# Patient Record
Sex: Female | Born: 1985 | Race: White | Hispanic: No | Marital: Married | Smoking: Former smoker
Health system: Southern US, Community
[De-identification: ages and names within clinical notes are randomized; demographics above are authoritative.]

## PROBLEM LIST (undated history)

## (undated) DIAGNOSIS — R1013 Epigastric pain: Secondary | ICD-10-CM

## (undated) DIAGNOSIS — R519 Headache, unspecified: Secondary | ICD-10-CM

## (undated) DIAGNOSIS — R112 Nausea with vomiting, unspecified: Secondary | ICD-10-CM

## (undated) DIAGNOSIS — N938 Other specified abnormal uterine and vaginal bleeding: Secondary | ICD-10-CM

## (undated) DIAGNOSIS — R51 Headache: Secondary | ICD-10-CM

## (undated) HISTORY — DX: Other specified abnormal uterine and vaginal bleeding: N93.8

## (undated) HISTORY — PX: WISDOM TOOTH EXTRACTION: SHX21

---

## 2010-09-06 ENCOUNTER — Ambulatory Visit: Payer: Self-pay | Admitting: Unknown Physician Specialty

## 2010-09-11 ENCOUNTER — Inpatient Hospital Stay: Payer: Self-pay

## 2013-03-09 ENCOUNTER — Ambulatory Visit: Payer: Self-pay | Admitting: Obstetrics and Gynecology

## 2013-03-09 LAB — CBC WITH DIFFERENTIAL/PLATELET
Basophil #: 0 10*3/uL (ref 0.0–0.1)
Basophil %: 0.2 %
HCT: 35.1 % (ref 35.0–47.0)
Lymphocyte #: 1.9 10*3/uL (ref 1.0–3.6)
Lymphocyte %: 22.8 %
MCH: 30.4 pg (ref 26.0–34.0)
MCHC: 34.5 g/dL (ref 32.0–36.0)
MCV: 88 fL (ref 80–100)
Neutrophil #: 5.6 10*3/uL (ref 1.4–6.5)
Neutrophil %: 68.3 %
Platelet: 137 10*3/uL — ABNORMAL LOW (ref 150–440)
RDW: 14.7 % — ABNORMAL HIGH (ref 11.5–14.5)

## 2013-03-10 ENCOUNTER — Inpatient Hospital Stay: Payer: Self-pay | Admitting: Obstetrics & Gynecology

## 2013-03-10 LAB — PLATELET COUNT: Platelet: 117 10*3/uL — ABNORMAL LOW (ref 150–440)

## 2013-03-11 LAB — HEMATOCRIT: HCT: 29.8 % — ABNORMAL LOW (ref 35.0–47.0)

## 2013-07-14 DIAGNOSIS — N323 Diverticulum of bladder: Secondary | ICD-10-CM | POA: Insufficient documentation

## 2014-12-31 NOTE — Op Note (Signed)
PATIENT NAME:  Sharon Weaver, Sharon Weaver MR#:  [id number] DATE OF BIRTH:  1986-02-03  DATE OF PROCEDURE:  03/10/2013  PREOPERATIVE DIAGONSES:  1.  History of prior low transverse C-section.  2.  Term intrauterine pregnancy at 40 weeks, 0 days gestation.   POSTOPERATIVE DIAGONSES:  1.  History of prior low transverse C-section.  2.  Term intrauterine pregnancy at 40 weeks, 0 days gestation.  OPERATION PERFORMED:  Repeat low transverse C-section via Pfannenstiel skin incision.   ANESTHESIA USED:  Spinal.  PRIMARY SURGEON:  Sharon Weaver, M.D.  ASSISTANT:  Sharon Weaver, M.D.   ESTIMATED BLOOD LOSS:  500 mL.   OPERATIVE FLUIDS:  1 liter.   COMPLICATIONS:  None.   INTRAOPERATIVE FINDINGS:  Normal tubes, ovaries and uterus with minimal scarring of the rectus fascia.  Delivery resulted in the birth of a liveborn female infant weighing 6 pounds 14 ounces, 3110 grams, Apgars 9 and 10.   SPECIMENS REMOVED:  None.    THE PATIENT CONDITION FOLLOWING PROCEDURE:  Stable.   PROCEDURE IN DETAIL:  Risks, benefits and alternatives of the procedure were discussed with the patient prior to proceeding to the Operating Room.  The patient was administered spinal anesthesia, positioned in the supine position, prepped and draped in the usual sterile fashion.  Prior to proceeding with the case, timeout was performed and the local anesthetic was noted to be adequate.  Pfannenstiel skin incision was made 2 cm above the pubic symphysis using the patient's pre-existing scar.  This was carried down sharply to the level of the rectus fascia which was incised in the midline using a knife.  The fascial incision was extended using manual traction.  The superior border of the rectus fascia was then grasped with 2 Coker clamps.  The underlying rectus muscle was dissected off the rectus fascia and the median raphe incised using Mayo scissors.  The inferior border of the rectus fascia was dissected off the rectus muscle in a  similar fashion.  The midline was identified.  The peritoneum was entered using Metzenbaum scissors.  Following entry into the peritoneal cavity, the peritoneal incision was extended using manual traction.  A bladder blade was placed.  A bladder flap was created and the bladder blade was retracting the bladder caudad.  Following this, a low transverse incision was made on the uterus.  The hysterotomy was entered bluntly using the operator's finger.  Clear fluid was noted.  Upon placing the operator's hand into the hysterotomy incision, the fetus was noted to be in the OA position.  The vertex was grasped, flexed, brought to the incision, had to be delivered using a vacuum.  The remainder of the body delivered with ease.  The infant was suctioned, cord was clamped and cut and the infant was passed to awaiting pediatrician.  Cord blood was obtained.  The placenta was delivered using manual extraction.  Hysterotomy was repaired using a two layer closure of 0 Vicryl.  Following hysterotomy closure, the pelvis was irrigated and hysterotomy incision was noted to be hemostatic.  The rectus muscle was reapproximated in the midline using a 2-0 Vicryl and a mattress stitch.  The On-Q catheters were then placed per the usual protocol.  The fascial incision was closed using a looped #1 PDS.  Following fascial closure, the subcutaneous tissue was irrigated.  Hemostasis achieved using the Bovie.  Skin was closed using Insorb staples.  Sponge, needle and instrument counts were correct x 2.  The On-Q catheters were bolused and the  patient was taken to the recovery room in stable condition.     ____________________________ Sharon Ou. Bonney Aid, MD ams:ea D: 03/12/2013 18:20:28 ET T: 03/13/2013 02:56:42 ET JOB#: [id number]  cc: Sharon Ou. Bonney Aid, MD, <Dictator> [place] Cathrine Muster MD ELECTRONICALLY SIGNED 03/31/2013 14:17

## 2015-09-01 LAB — HM PAP SMEAR

## 2015-11-30 ENCOUNTER — Encounter: Payer: Self-pay | Admitting: Emergency Medicine

## 2015-11-30 DIAGNOSIS — R42 Dizziness and giddiness: Secondary | ICD-10-CM | POA: Insufficient documentation

## 2015-11-30 DIAGNOSIS — R002 Palpitations: Secondary | ICD-10-CM | POA: Insufficient documentation

## 2015-11-30 LAB — CBC
HEMATOCRIT: 40.2 % (ref 35.0–47.0)
Hemoglobin: 13.9 g/dL (ref 12.0–16.0)
MCH: 30 pg (ref 26.0–34.0)
MCHC: 34.6 g/dL (ref 32.0–36.0)
MCV: 86.6 fL (ref 80.0–100.0)
Platelets: 187 10*3/uL (ref 150–440)
RBC: 4.64 MIL/uL (ref 3.80–5.20)
RDW: 12.8 % (ref 11.5–14.5)
WBC: 6.4 10*3/uL (ref 3.6–11.0)

## 2015-11-30 LAB — BASIC METABOLIC PANEL
Anion gap: 5 (ref 5–15)
BUN: 8 mg/dL (ref 6–20)
CHLORIDE: 111 mmol/L (ref 101–111)
CO2: 21 mmol/L — ABNORMAL LOW (ref 22–32)
CREATININE: 0.69 mg/dL (ref 0.44–1.00)
Calcium: 9.6 mg/dL (ref 8.9–10.3)
GFR calc Af Amer: >60 mL/min (ref >60)
GFR calc non Af Amer: >60 mL/min (ref >60)
GLUCOSE: 120 mg/dL — AB (ref 65–99)
POTASSIUM: 3.3 mmol/L — AB (ref 3.5–5.1)
Sodium: 137 mmol/L (ref 135–145)

## 2015-11-30 LAB — URINALYSIS COMPLETE WITH MICROSCOPIC (ARMC ONLY)
BILIRUBIN URINE: NEGATIVE
Bacteria, UA: NONE SEEN
GLUCOSE, UA: NEGATIVE mg/dL
Hgb urine dipstick: NEGATIVE
Ketones, ur: NEGATIVE mg/dL
LEUKOCYTES UA: NEGATIVE
NITRITE: NEGATIVE
Protein, ur: NEGATIVE mg/dL
Specific Gravity, Urine: 1.006 (ref 1.005–1.030)
pH: 7 (ref 5.0–8.0)

## 2015-11-30 LAB — TROPONIN I

## 2015-11-30 NOTE — ED Notes (Signed)
Pt in by EMS with co dizziness and weakness.  Family has been sick at home with flu like symptoms.  No n.v.d at this time.

## 2015-11-30 NOTE — ED Notes (Addendum)
Pt to triage via w/c with no distress noted, brought in by EMS; pt reports tonight had sudden onset dizziness and sensation of heart racing; denies hx of same; recent cold symptoms that persists; denies symptoms at present

## 2015-12-01 ENCOUNTER — Emergency Department
Admission: EM | Admit: 2015-12-01 | Discharge: 2015-12-01 | Disposition: A | Discharge status: Home / Self Care | Payer: Commercial Managed Care - HMO | Attending: Emergency Medicine | Admitting: Emergency Medicine

## 2015-12-01 DIAGNOSIS — R42 Dizziness and giddiness: Secondary | ICD-10-CM

## 2015-12-01 DIAGNOSIS — R002 Palpitations: Secondary | ICD-10-CM

## 2015-12-01 LAB — TSH: TSH: 5.113 u[IU]/mL — ABNORMAL HIGH (ref 0.350–4.500)

## 2015-12-01 LAB — POCT PREGNANCY, URINE: PREG TEST UR: NEGATIVE

## 2015-12-01 MED ORDER — POTASSIUM CHLORIDE CRYS ER 20 MEQ PO TBCR
40.0000 meq | EXTENDED_RELEASE_TABLET | Freq: Once | ORAL | Status: AC
  Administered 2015-12-01: 40 meq via ORAL
  Filled 2015-12-01: qty 2

## 2015-12-01 MED ORDER — SODIUM CHLORIDE 0.9 % IV BOLUS (SEPSIS)
500.0000 mL | Freq: Once | INTRAVENOUS | Status: AC
  Administered 2015-12-01: 500 mL via INTRAVENOUS

## 2015-12-01 NOTE — ED Provider Notes (Signed)
[facility] Emergency Department Provider Note  ____________________________________________  Time seen: Approximately 12:53 AM  I have reviewed the triage vital signs and the nursing notes.   HISTORY  Chief Complaint Dizziness    HPI Sharon Weaver is a 30 y.o. female who presents to the ED from home via EMS with a chief complaint of dizziness and weakness. Patient has had cold type symptoms for the past several days, bent down totake her baby out of the bathtub and became lightheaded and generally weak. Noted associated symptoms of heart racing which is now resolved. Denies fever, chills, chest pain, shortness of breath, abdominal pain, nausea, vomiting, diarrhea, diaphoresis. Denies use of decongestants. States she took a TheraFlu last evening. Drinks 2 large cups of coffee daily. In addition also drinks tea. Denies recent travel or trauma. Nothing makes her symptoms worse. Feels better without intervention.   Past medical history None  There are no active problems to display for this patient.   Past Surgical History  Procedure Laterality Date  . Cesarean section      Current Outpatient Rx  Name  Route  Sig  Dispense  Refill  . SPRINTEC 28 0.25-35 MG-MCG tablet   Oral   Take 1 tablet by mouth daily.           Dispense as written.     Allergies Review of patient's allergies indicates no known allergies.  No family history on file.  Social History Social History  Substance Use Topics  . Smoking status: Never Smoker   . Smokeless tobacco: None  . Alcohol Use: No    Review of Systems  Constitutional: No fever/chills. Eyes: No visual changes. ENT: Positive for sinus congestion. No sore throat. Cardiovascular: Positive for palpitations. Denies chest pain. Respiratory: Denies shortness of breath. Gastrointestinal: No abdominal pain.  No nausea, no vomiting.  No diarrhea.  No constipation. Genitourinary: Negative for  dysuria. Musculoskeletal: Negative for back pain. Skin: Negative for rash. Neurological: Positive for lightheadedness. Negative for headaches, focal weakness or numbness.  10-point ROS otherwise negative.  ____________________________________________   PHYSICAL EXAM:  VITAL SIGNS: ED Triage Vitals  Enc Vitals Group     BP 11/30/15 2217 139/83 mmHg     Pulse Rate 11/30/15 2217 96     Resp 11/30/15 2217 18     Temp 11/30/15 2217 98.1 F (36.7 C)     Temp Source 11/30/15 2217 Oral     SpO2 11/30/15 2217 99 %     Weight 11/30/15 2217 156 lb (70.761 kg)     Height 11/30/15 2217  (1.626 m)     Head Cir --      Peak Flow --      Pain Score --      Pain Loc --      Pain Edu? --      Excl. in GC? --     Constitutional: Alert and oriented. Well appearing and in no acute distress. Eyes: Conjunctivae are normal. PERRL. EOMI. Head: Atraumatic. Ears: Mild fluid behind both TMs. Nose: Congestion/rhinnorhea. Mouth/Throat: Mucous membranes are moist.  Oropharynx non-erythematous. Neck: No stridor.  No carotid bruits.  Cardiovascular: Normal rate, regular rhythm. Grossly normal heart sounds.  Good peripheral circulation. Respiratory: Normal respiratory effort.  No retractions. Lungs CTAB. Gastrointestinal: Soft and nontender. No distention. No abdominal bruits. No CVA tenderness. Musculoskeletal: No lower extremity tenderness nor edema.  No joint effusions. Neurologic:  Normal speech and language. No gross focal neurologic deficits are appreciated. No  gait instability. Skin:  Skin is warm, dry and intact. No rash noted. Psychiatric: Mood and affect are normal. Speech and behavior are normal.  ____________________________________________   LABS (all labs ordered are listed, but only abnormal results are displayed)  Labs Reviewed  BASIC METABOLIC PANEL - Abnormal; Notable for the following:    Potassium 3.3 (*)    CO2 21 (*)    Glucose, Bld 120 (*)    All other components  within normal limits  URINALYSIS COMPLETEWITH MICROSCOPIC ([facility] ONLY) - Abnormal; Notable for the following:    Color, Urine STRAW (*)    APPearance CLEAR (*)    Squamous Epithelial / LPF 0-5 (*)    All other components within normal limits  TSH - Abnormal; Notable for the following:    TSH 5.113 (*)    All other components within normal limits  CBC  TROPONIN I  POC URINE PREG, ED  POCT PREGNANCY, URINE   ____________________________________________  EKG  ED ECG REPORT I, SUNG,JADE J, the attending physician, personally viewed and interpreted this ECG.   Date: 12/01/2015  EKG Time: 2224  Rate: 88  Rhythm: normal EKG, normal sinus rhythm  Axis: Normal  Intervals:none  ST&T Change: Nonspecific  ____________________________________________  RADIOLOGY  None ____________________________________________   PROCEDURES  Procedure(s) performed: None  Critical Care performed: No  ____________________________________________   INITIAL IMPRESSION / ASSESSMENT AND PLAN / ED COURSE  Pertinent labs & imaging results that were available during my care of the patient were reviewed by me and considered in my medical decision making (see chart for details).  30 year old female who presents with dizziness and palpitations in the setting of recent URI. Laboratory and urinalysis results remarkable for mild hypokalemia which we will replete in the emergency department. Noted patient became hypertensive on orthostatics but she does not remain extremely hypertensive. Currently resting with blood pressure 128/97. Low suspicion for pheochromocytoma. Will check thyroid function.  ----------------------------------------- 3:02 AM on 12/01/2015 -----------------------------------------  Patient resting in no acute distress. Overall feels much better. Will refer to cardiology for outpatient follow-up for palpitations. Strict return precautions given. Patient and spouse verbalize  understanding and agree with plan of care. ____________________________________________   FINAL CLINICAL IMPRESSION(S) / ED DIAGNOSES  Final diagnoses:  Dizziness  Palpitations      Irean HongJade J Sung, MD 12/01/15 [phone]

## 2015-12-01 NOTE — ED Notes (Signed)

## 2015-12-01 NOTE — Discharge Instructions (Signed)
1. Reduce daily caffeine intake. 2. Drink plenty of fluids daily. 3. Return to the ER for worsening symptoms, persistent vomiting, difficulty breathing or other concerns.  Dizziness Dizziness is a common problem. It is a feeling of unsteadiness or light-headedness. You may feel like you are about to faint. Dizziness can lead to injury if you stumble or fall. Anyone can become dizzy, but dizziness is more common in older adults. This condition can be caused by a number of things, including medicines, dehydration, or illness. HOME CARE INSTRUCTIONS Taking these steps may help with your condition: Eating and Drinking  Drink enough fluid to keep your urine clear or pale yellow. This helps to keep you from becoming dehydrated. Try to drink more clear fluids, such as water.  Do not drink alcohol.  Limit your caffeine intake if directed by your health care provider.  Limit your salt intake if directed by your health care provider. Activity  Avoid making quick movements.  Rise slowly from chairs and steady yourself until you feel okay.  In the morning, first sit up on the side of the bed. When you feel okay, stand slowly while you hold onto something until you know that your balance is fine.  Move your legs often if you need to stand in one place for a long time. Tighten and relax your muscles in your legs while you are standing.  Do not drive or operate heavy machinery if you feel dizzy.  Avoid bending down if you feel dizzy. Place items in your home so that they are easy for you to reach without leaning over. Lifestyle  Do not use any tobacco products, including cigarettes, chewing tobacco, or electronic cigarettes. If you need help quitting, ask your health care provider.  Try to reduce your stress level, such as with yoga or meditation. Talk with your health care provider if you need help. General Instructions  Watch your dizziness for any changes.  Take medicines only as directed  by your health care provider. Talk with your health care provider if you think that your dizziness is caused by a medicine that you are taking.  Tell a friend or a family member that you are feeling dizzy. If he or she notices any changes in your behavior, have this person call your health care provider.  Keep all follow-up visits as directed by your health care provider. This is important. SEEK MEDICAL CARE IF:  Your dizziness does not go away.  Your dizziness or light-headedness gets worse.  You feel nauseous.  You have reduced hearing.  You have new symptoms.  You are unsteady on your feet or you feel like the room is spinning. SEEK IMMEDIATE MEDICAL CARE IF:  You vomit or have diarrhea and are unable to eat or drink anything.  You have problems talking, walking, swallowing, or using your arms, hands, or legs.  You feel generally weak.  You are not thinking clearly or you have trouble forming sentences. It may take a friend or family member to notice this.  You have chest pain, abdominal pain, shortness of breath, or sweating.  Your vision changes.  You notice any bleeding.  You have a headache.  You have neck pain or a stiff neck.  You have a fever.   This information is not intended to replace advice given to you by your health care provider. Make sure you discuss any questions you have with your health care provider.   Document Released: 02/20/2001 Document Revised: 01/11/2015 Document Reviewed:  08/23/2014 Elsevier Interactive Patient Education 2016 [employer].  Palpitations A palpitation is the feeling that your heartbeat is irregular or is faster than normal. It may feel like your heart is fluttering or skipping a beat. Palpitations are usually not a serious problem. However, in some cases, you may need further medical evaluation. CAUSES  Palpitations can be caused by:  Smoking.  Caffeine or other stimulants, such as diet pills or energy  drinks.  Alcohol.  Stress and anxiety.  Strenuous physical activity.  Fatigue.  Certain medicines.  Heart disease, especially if you have a history of irregular heart rhythms (arrhythmias), such as atrial fibrillation, atrial flutter, or supraventricular tachycardia.  An improperly working pacemaker or defibrillator. DIAGNOSIS  To find the cause of your palpitations, your health care provider will take your medical history and perform a physical exam. Your health care provider may also have you take a test called an ambulatory electrocardiogram (ECG). An ECG records your heartbeat patterns over a 24-hour period. You may also have other tests, such as:  Transthoracic echocardiogram (TTE). During echocardiography, sound waves are used to evaluate how blood flows through your heart.  Transesophageal echocardiogram (TEE).  Cardiac monitoring. This allows your health care provider to monitor your heart rate and rhythm in real time.  Holter monitor. This is a portable device that records your heartbeat and can help diagnose heart arrhythmias. It allows your health care provider to track your heart activity for several days, if needed.  Stress tests by exercise or by giving medicine that makes the heart beat faster. TREATMENT  Treatment of palpitations depends on the cause of your symptoms and can vary greatly. Most cases of palpitations do not require any treatment other than time, relaxation, and monitoring your symptoms. Other causes, such as atrial fibrillation, atrial flutter, or supraventricular tachycardia, usually require further treatment. HOME CARE INSTRUCTIONS   Avoid:  Caffeinated coffee, tea, soft drinks, diet pills, and energy drinks.  Chocolate.  Alcohol.  Stop smoking if you smoke.  Reduce your stress and anxiety. Things that can help you relax include:  A method of controlling things in your body, such as your heartbeats, with your mind  (biofeedback).  Yoga.  Meditation.  Physical activity such as swimming, jogging, or walking.  Get plenty of rest and sleep. SEEK MEDICAL CARE IF:   You continue to have a fast or irregular heartbeat beyond 24 hours.  Your palpitations occur more often. SEEK IMMEDIATE MEDICAL CARE IF:  You have chest pain or shortness of breath.  You have a severe headache.  You feel dizzy or you faint. MAKE SURE YOU:  Understand these instructions.  Will watch your condition.  Will get help right away if you are not doing well or get worse.   This information is not intended to replace advice given to you by your health care provider. Make sure you discuss any questions you have with your health care provider.   Document Released: 08/24/2000 Document Revised: 09/01/2013 Document Reviewed: 10/26/2011 Elsevier Interactive Patient Education [employer]

## 2015-12-06 DIAGNOSIS — R42 Dizziness and giddiness: Secondary | ICD-10-CM | POA: Insufficient documentation

## 2015-12-06 DIAGNOSIS — Z87898 Personal history of other specified conditions: Secondary | ICD-10-CM | POA: Insufficient documentation

## 2015-12-19 ENCOUNTER — Ambulatory Visit: Payer: Commercial Managed Care - HMO | Admitting: Cardiology

## 2015-12-29 ENCOUNTER — Emergency Department: Payer: Commercial Managed Care - HMO

## 2015-12-29 ENCOUNTER — Emergency Department
Admission: EM | Admit: 2015-12-29 | Discharge: 2015-12-29 | Disposition: A | Discharge status: Home / Self Care | Payer: Commercial Managed Care - HMO | Attending: Emergency Medicine | Admitting: Emergency Medicine

## 2015-12-29 DIAGNOSIS — Y929 Unspecified place or not applicable: Secondary | ICD-10-CM | POA: Diagnosis not present

## 2015-12-29 DIAGNOSIS — X58XXXA Exposure to other specified factors, initial encounter: Secondary | ICD-10-CM | POA: Insufficient documentation

## 2015-12-29 DIAGNOSIS — M79662 Pain in left lower leg: Secondary | ICD-10-CM

## 2015-12-29 DIAGNOSIS — S86112A Strain of other muscle(s) and tendon(s) of posterior muscle group at lower leg level, left leg, initial encounter: Secondary | ICD-10-CM | POA: Diagnosis not present

## 2015-12-29 DIAGNOSIS — Y939 Activity, unspecified: Secondary | ICD-10-CM | POA: Insufficient documentation

## 2015-12-29 DIAGNOSIS — S86812A Strain of other muscle(s) and tendon(s) at lower leg level, left leg, initial encounter: Secondary | ICD-10-CM

## 2015-12-29 DIAGNOSIS — Y999 Unspecified external cause status: Secondary | ICD-10-CM | POA: Diagnosis not present

## 2015-12-29 MED ORDER — IBUPROFEN 800 MG PO TABS: 800.0000 mg | ORAL_TABLET | Freq: Three times a day (TID) | ORAL | Status: DC | PRN

## 2015-12-29 MED ORDER — CYCLOBENZAPRINE HCL 10 MG PO TABS: 10.0000 mg | ORAL_TABLET | Freq: Three times a day (TID) | ORAL | Status: DC | PRN

## 2015-12-29 NOTE — ED Notes (Signed)
Pt noticed pain to the right calf on Tuesday, pt states she sits a lot and is worried about a blood clot.

## 2015-12-29 NOTE — ED Provider Notes (Addendum)
[facility] Emergency Department Provider Note     Time seen: ----------------------------------------- 11:00 PM on 12/29/2015 -----------------------------------------    I have reviewed the triage vital signs and the nursing notes.   HISTORY  Chief Complaint Leg Pain    HPI Sharon Weaver is a 30 y.o. female who presents ER for left calf pain since Tuesday. Patient states she sits a lot and she was worried she had a blood clot. She states sometimes she feelscold and tingling in this leg. She denies any fevers and falls or trauma, denies any history of this. Nothing makes it better or worse.   No past medical history on file.  There are no active problems to display for this patient.   Past Surgical History  Procedure Laterality Date  . Cesarean section      Allergies Review of patient's allergies indicates no known allergies.  Social History Social History  Substance Use Topics  . Smoking status: Never Smoker   . Smokeless tobacco: Not on file  . Alcohol Use: No    Review of Systems Constitutional: Negative for fever. Cardiovascular: Negative for chest pain. Respiratory: Negative for shortness of breath. Gastrointestinal: Negative for abdominal pain, vomiting and diarrhea. Genitourinary: Negative for dysuria. Musculoskeletal: Positive for left calf pain Skin: Negative for rash. Neurological: Negative for headaches, focal weakness or numbness. ____________________________________________   PHYSICAL EXAM:  VITAL SIGNS: ED Triage Vitals  Enc Vitals Group     BP 12/29/15 2112 112/99 mmHg     Pulse Rate 12/29/15 2112 92     Resp 12/29/15 2112 18     Temp 12/29/15 2112 98.7 F (37.1 C)     Temp Source 12/29/15 2112 Oral     SpO2 12/29/15 2112 100 %     Weight 12/29/15 2112 159 lb (72.122 kg)     Height 12/29/15 2112 5\' 4"  (1.626 m)     Head Cir --      Peak Flow --      Pain Score 12/29/15 2113 1     Pain Loc --      Pain Edu?  --      Excl. in GC? --    Constitutional: Alert and oriented. Well appearing and in no distress. Eyes: Conjunctivae are normal. PERRL. Normal extraocular movements. Cardiovascular: Normal rate, regular rhythm. No murmurs, rubs, or gallops. Respiratory: Normal respiratory effort without tachypnea nor retractions. Breath sounds are clear and equal bilaterally. No wheezes/rales/rhonchi. Musculoskeletal: Nontender with normal range of motion in all extremities. No lower extremity tenderness nor edema. Neurologic:  Normal speech and language. No gross focal neurologic deficits are appreciated.  Skin:  Skin is warm, dry and intact. No rash noted. ___________________________________________  ED COURSE:  Pertinent labs & imaging results that were available during my care of the patient were reviewed by me and considered in my medical decision making (see chart for details). Patient is no acute distress, will obtain an ultrasound and reevaluate. ____________________________________________   RADIOLOGY  Left lower extremity ultrasound is unremarkable  ____________________________________________  FINAL ASSESSMENT AND PLAN  Calf strain  Plan: Patient with imaging as dictated above, ankle-brachial indices is normal. Patient is in no acute distress, I will advise Motrin and Flexeril to take as needed. She stable for outpatient follow-up with her doctor.   Emily FilbertWilliams, Jonathan E, MD   Emily FilbertJonathan E Williams, MD 12/29/15 [id number]  Emily FilbertJonathan E Williams, MD 12/29/15 [id number]

## 2015-12-29 NOTE — ED Notes (Signed)
Pt in with co left calf pain since Tuesday, states foot feels cold and tingling at times.  Pulses positive bilat slight coolness to left foot color wnl.

## 2015-12-29 NOTE — ED Notes (Signed)
FIRST NURSE NOTE: Patient presents with c/o LEFT lower extremity "discomfort" since Tuesday. Patient reports that her leg is cold with (+) numbness and tingling to her foot. Patient with no gait abnormalities noted when patient observed ambulating from entrance to stat desk.

## 2015-12-29 NOTE — Discharge Instructions (Signed)
Medial Head Gastrocnemius Tear With Rehab °Medial head gastrocnemius tear, also called tennis leg, is a tear (strain) in a muscle or tendon of the inner portion (medial head) of one of the calf muscles (gastrocnemius). The inner portion of the calf muscle attaches to the thigh bone (femur) and is responsible for bending the knee and straightening the foot (standing "on tiptoe"). Strains are classified into three categories. Grade 1 strains cause pain, but the tendon is not lengthened. Grade 2 strains include a lengthened ligament, due to the ligament being stretched or partially ruptured. With grade 2 strains there is still function, although function may be decreased. Grade 3 strains involve a complete tear of the tendon or muscle, and function is usually impaired. °SYMPTOMS  °· Sudden "pop" or tear felt at the time of injury. °· Pain, tenderness, swelling, warmth, or redness over the middle inner calf. °· Pain and weakness with ankle motion, especially flexing the ankle against resistance, as well as pain with lifting up the foot (extending the ankle). °· Bruising (contusion) of the calf, heel, and sometimes the foot within 48 hours of injury. °· Muscle spasm in the calf. °CAUSES  °Muscle and ligament strains occur when a force is placed on the muscle or ligament that is greater than it can handle. Common causes of injury include: °· Direct hit (trauma) to the calf. °· Sudden forceful pushing off or landing on the foot (jumping, landing, serving a tennis ball, lunging). °RISK INCREASES WITH: °· Sports that require sudden, explosive calf muscle contraction, such as those involving jumping (basketball), hill running, quick starts (running), or lunging (racquetball, tennis). °· Contact sports (football, soccer, hockey). °· Poor strength and flexibility. °· Previous lower limb injury. °PREVENTION °· Warm up and stretch properly before activity. °· Allow for adequate recovery between workouts. °· Maintain physical  fitness: °¨ Strength, flexibility, and endurance. °¨ Cardiovascular fitness. °· Learn and use proper exercise technique. °· Complete rehabilitation after lower limb injury, before returning to competition or practice. °PROGNOSIS  °If treated properly, tennis leg usually heals within 6 weeks of nonsurgical treatment.  °RELATED COMPLICATIONS  °· Longer healing time, if not properly treated or if not given enough time to heal. °· Recurring symptoms and injury, if activity is resumed too soon, with overuse, with a direct blow, or with poor technique. °· If untreated, may progress to a complete tear (rare) or other injury, due to limping and favoring of the injured leg. °· Persistent limping, due to scarring and shortening of the calf muscles, as a result of inadequate rehabilitation. °· Prolonged disability. °TREATMENT  °Treatment first involves the use of ice and medication to help reduce pain and inflammation. The use of strengthening and stretching exercises may help reduce pain with activity. These exercises may be performed at home or with a therapist. For severe injuries, referral to a therapist may be needed for further evaluation and treatment. Your caregiver may advise that you wear a brace to help healing. Sometimes, crutches are needed until you can walk without limping. Rarely, surgery is needed.  °MEDICATION  °· If pain medicine is needed, nonsteroidal anti-inflammatory medicines (aspirin and ibuprofen), or other minor pain relievers (acetaminophen), are often advised. °· Do not take pain medicine for 7 days before surgery. °· Prescription pain relievers may be given, if your caregiver thinks they are needed. Use only as directed and only as much as you need. °HEAT AND COLD °· Cold treatment (icing) should be applied for 10 to 15 minutes every   2 to 3 hours for inflammation and pain, and immediately after activity that aggravates your symptoms. Use ice packs or an ice massage. °· Heat treatment may be used  before performing stretching and strengthening activities prescribed by your caregiver, physical therapist, or athletic trainer. Use a heat pack or a warm water soak. °SEEK MEDICAL CARE IF:  °· Symptoms get worse or do not improve in 2 weeks, despite treatment. °· Numbness or tingling develops. °· New, unexplained symptoms develop. (Drugs used in treatment may produce side effects.) °EXERCISES  °RANGE OF MOTION (ROM) AND STRETCHING EXERCISES - Medial Head Gastrocnemius Tear (Tennis Leg) °These exercises may help you when beginning to rehabilitate your injury. Your symptoms may resolve with or without further involvement from your physician, physical therapist, or athletic trainer. While completing these exercises, remember:  °· Restoring tissue flexibility helps normal motion to return to the joints. This allows healthier, less painful movement and activity. °· An effective stretch should be held for at least 30 seconds. °· A stretch should never be painful. You should only feel a gentle lengthening or release in the stretched tissue. °STRETCH - Gastrocsoleus °· Sit with your right / left leg extended. Holding onto both ends of a belt or towel, loop it around the ball of your foot. °· Keeping your right / left ankle and foot relaxed and your knee straight, pull your foot and ankle toward you using the belt. °· You should feel a gentle stretch behind your calf or knee. Hold this position for __________ seconds. °Repeat __________ times. Complete this stretch __________ times per day.  °RANGE OF MOTION - Ankle Dorsiflexion, Active Assisted  °· Remove your shoes and sit on a chair, preferably not on a carpeted surface. °· Place your right / left foot directly under the knee. Extend your opposite leg for support. °· Keeping your heel down, slide your right / left foot back toward the chair, until you feel a stretch at your ankle or calf. If you do not feel a stretch, slide your bottom forward to the edge of the chair,  while still keeping your heel down. °· Hold this stretch for __________ seconds. °Repeat __________ times. Complete this stretch __________ times per day.  °STRETCH - Gastroc, Standing  °· Place your hands on a wall. °· Extend your right / left leg behind you, keeping the front knee somewhat bent. °· Slightly point your toes inward on your back foot. °· Keeping your right / left heel on the floor and your knee straight, shift your weight toward the wall, not allowing your back to arch. °· You should feel a gentle stretch in the right / left calf. Hold this position for __________ seconds. °Repeat __________ times. Complete this stretch __________ times per day. °STRETCH - Soleus, Standing  °· Place your hands on a wall. °· Extend your right / left leg behind you, keeping the other knee somewhat bent. °· Point your toes of your back foot slightly inward. °· Keep your right / left heel on the floor, bend your back knee, and slightly shift your weight over the back leg so that you feel a gentle stretch deep in your back calf. °· Hold this position for __________ seconds. °Repeat __________ times. Complete this stretch __________ times per day. °STRETCH - Gastrocsoleus, Standing °Note: This exercise can place a lot of stress on your foot and ankle. Please complete this exercise only if specifically instructed by your caregiver.  °· Place the ball of your right /   left foot on a step, keeping your other foot firmly on the same step. °· Hold on to the wall or a rail for balance. °· Slowly lift your other foot, allowing your body weight to press your heel down over the edge of the step. °· You should feel a stretch in your right / left calf. °· Hold this position for __________ seconds. °· Repeat this exercise with a slight bend in your right / left knee. °Repeat __________ times. Complete this stretch __________ times per day.  °STRENGTHENING EXERCISES - Medial Head Gastrocnemius Tear (Tennis Leg) °These exercises may help  you when beginning to rehabilitate your injury. They may resolve your symptoms with or without further involvement from your physician, physical therapist, or athletic trainer. While completing these exercises, remember:  °· Muscles can gain both the endurance and the strength needed for everyday activities through controlled exercises. °· Complete these exercises as instructed by your physician, physical therapist, or athletic trainer. Increase the resistance and repetitions only as guided by your caregiver. °STRENGTH - Plantar-flexors °· Sit with your right / left leg extended. Holding onto both ends of a rubber exercise band or tubing, loop it around the ball of your foot. Keep a slight tension in the band. °· Slowly push your toes away from you, pointing them downward. °· Hold this position for __________ seconds. Return slowly, controlling the tension in the band. °Repeat __________ times. Complete this exercise __________ times per day.  °STRENGTH - Plantar-flexors °· Stand with your feet shoulder width apart. Steady yourself with a wall or table, using as little support as needed. °· Keeping your weight evenly spread over the width of your feet, rise up on your toes.* °· Hold this position for __________ seconds. °Repeat __________ times. Complete this exercise __________ times per day.  °*If this is too easy, shift your weight toward your right / left leg until you feel challenged. Ultimately, you may be asked to do this exercise while standing on your right / left foot only. °STRENGTH - Plantar-flexors, Eccentric °Note: This exercise can place a lot of stress on your foot and ankle. Please complete this exercise only if specifically instructed by your caregiver.  °· Place the balls of your feet on a step. With your hands, use only enough support from a wall or rail to keep your balance. °· Keep your knees straight and rise up on your toes. °· Slowly shift your weight entirely to your right / left toes and  pick up your opposite foot. Gently and with controlled movement, lower your weight through your right / left foot so that your heel drops below the level of the step. You will feel a slight stretch in the back of your right / left calf. °· Use the healthy leg to help rise up onto the balls of both feet, then lower weight only onto the right / left leg again. Build up to 15 repetitions. Then progress to 3 sets of 15 repetitions.* °· After completing the above exercise, complete the same exercise with a slight knee bend (about 30 degrees). Again, build up to 15 repetitions. Then progress to 3 sets of 15 repetitions.* °Perform this exercise __________ times per day.  °*When you easily complete 3 sets of 15, your physician, physical therapist, or athletic trainer may advise you to add resistance, by wearing a backpack filled with additional weight. °  °This information is not intended to replace advice given to you by your health care provider. Make   sure you discuss any questions you have with your health care provider. °  °Document Released: 08/27/2005 Document Revised: 09/17/2014 Document Reviewed: 12/09/2008 °Elsevier Interactive Patient Education ©2016 [employer] ° °

## 2015-12-30 ENCOUNTER — Encounter: Payer: Self-pay | Admitting: Internal Medicine

## 2016-01-02 ENCOUNTER — Ambulatory Visit (INDEPENDENT_AMBULATORY_CARE_PROVIDER_SITE_OTHER): Payer: Commercial Managed Care - HMO | Admitting: Internal Medicine

## 2016-01-02 ENCOUNTER — Encounter: Payer: Self-pay | Admitting: Internal Medicine

## 2016-01-02 VITALS — BP 130/86 | HR 88 | Ht 64.0 in | Wt 160.0 lb

## 2016-01-02 DIAGNOSIS — R7989 Other specified abnormal findings of blood chemistry: Secondary | ICD-10-CM

## 2016-01-02 DIAGNOSIS — E876 Hypokalemia: Secondary | ICD-10-CM

## 2016-01-02 DIAGNOSIS — R42 Dizziness and giddiness: Secondary | ICD-10-CM

## 2016-01-02 DIAGNOSIS — Z8679 Personal history of other diseases of the circulatory system: Secondary | ICD-10-CM | POA: Diagnosis not present

## 2016-01-02 DIAGNOSIS — Z87898 Personal history of other specified conditions: Secondary | ICD-10-CM

## 2016-01-02 NOTE — Progress Notes (Signed)
Date:  01/02/2016   Name:  Sharon Weaver   DOB:  Feb 17, 1986   MRN:  [id number]  New patient here to establish care.  Chief Complaint: New Evaluation and Dizziness Dizziness This is a new problem. The current episode started more than 1 year ago. The problem occurs intermittently. The problem has been waxing and waning. Associated symptoms include myalgias. Pertinent negatives include no abdominal pain, arthralgias, chest pain, chills, coughing, fatigue, fever, headaches, joint swelling, numbness, rash, vertigo, vomiting or weakness. Exacerbated by: turning head quickly. She has tried nothing for the symptoms.   Palpitations - has one severe episode two months ago.  Went to the ER and evaluation was normal.  Her TSH was slightly elevated and her potassium was borderline low 3.3. There was a question about her EKG - she was seen by Cardiology and reassured all was normal.  She has not had any more episodes.   Calf pain - last week has muscle pain in left calf.  She thought it might be a blood clot so went to ER.  She was reassured it was negative.  She was prescribed nsaids and muscle relaxants but she did not take them.  She has not had much discomfort since.  Review of Systems  Constitutional: Negative for fever, chills, fatigue and unexpected weight change.  Eyes: Negative for visual disturbance.  Respiratory: Negative for cough, chest tightness and shortness of breath.   Cardiovascular: Negative for chest pain, palpitations and leg swelling.  Gastrointestinal: Negative for vomiting and abdominal pain.  Musculoskeletal: Positive for myalgias. Negative for joint swelling, arthralgias and gait problem.  Skin: Negative for rash.  Neurological: Positive for dizziness. Negative for vertigo, tremors, weakness, numbness and headaches.  Psychiatric/Behavioral: Negative for sleep disturbance and dysphoric mood.    Patient Active Problem List   Diagnosis Date Noted  . 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 Authorizing Provider  SPRINTEC 28 0.25-35 MG-MCG tablet Take 1 tablet by mouth daily. Reported on 01/02/2016 11/23/15   Historical Provider, MD    No Known Allergies  Past Surgical History  Procedure Laterality Date  . Cesarean section  09/11/10, 03/10/2013    Social History  Substance Use Topics  . Smoking status: Never Smoker   . Smokeless tobacco: None  . Alcohol Use: 4.8 oz/week    8 Glasses of wine per week     Comment: weekly     Medication list has been reviewed and updated.   Physical Exam  Constitutional: She is oriented to person, place, and time. She appears well-developed. No distress.  HENT:  Head: Normocephalic and atraumatic.  Right Ear: Tympanic membrane and ear canal normal.  Left Ear: Tympanic membrane and ear canal normal.  Nose: Nose normal. Right sinus exhibits no maxillary sinus tenderness. Left sinus exhibits no maxillary sinus tenderness.  Eyes: Conjunctivae and EOM are normal. Right eye exhibits no nystagmus. Left eye exhibits no nystagmus.  Neck: Normal range of motion. Neck supple.  Cardiovascular: Normal rate, regular rhythm and normal heart sounds.   Pulmonary/Chest: Effort normal and breath sounds normal. No respiratory distress.  Musculoskeletal: Normal range of motion. She exhibits no edema or tenderness.       Right knee: She exhibits normal range of motion and no swelling.       Left knee: She exhibits normal range of motion and no swelling.  Neurological: She is  alert and oriented to person, place, and time.  Skin: Skin is warm and dry. No rash noted.  Psychiatric: She has a normal mood and affect. Her behavior is normal. Thought content normal.  Nursing note and vitals reviewed.   BP 130/86 mmHg  Pulse 88  Ht  (1.626 m)  Wt 160 lb (72.576 kg)  BMI 27.45 kg/m2  LMP 12/25/2015  Assessment and Plan: 1.  History of palpitations Patient is reassured - no further evaluation unless recurrent/persistent  2. Hypokalemia Increase intake of potassium rich foods - Basic metabolic panel  3. Abnormal TSH Will check panel and advise - Thyroid Panel With TSH  4. Dizziness Mild and intermittent not requiring any intervention at this time   Bari Edward, MD [facility]  01/02/2016

## 2016-01-02 NOTE — Patient Instructions (Signed)
Breast Self-Awareness Practicing breast self-awareness may pick up problems early, prevent significant medical complications, and possibly save your life. By practicing breast self-awareness, you can become familiar with how your breasts look and feel and if your breasts are changing. This allows you to notice changes early. It can also offer you some reassurance that your breast health is good. One way to learn what is normal for your breasts and whether your breasts are changing is to do a breast self-exam. If you find a lump or something that was not present in the past, it is best to contact your caregiver right away. Other findings that should be evaluated by your caregiver include nipple discharge, especially if it is bloody; skin changes or reddening; areas where the skin seems to be pulled in (retracted); or new lumps and bumps. Breast pain is seldom associated with cancer (malignancy), but should also be evaluated by a caregiver. HOW TO PERFORM A BREAST SELF-EXAM The best time to examine your breasts is 5-7 days after your menstrual period is over. During menstruation, the breasts are lumpier, and it may be more difficult to pick up changes. If you do not menstruate, have reached menopause, or had your uterus removed (hysterectomy), you should examine your breasts at regular intervals, such as monthly. If you are breastfeeding, examine your breasts after a feeding or after using a breast pump. Breast implants do not decrease the risk for lumps or tumors, so continue to perform breast self-exams as recommended. Talk to your caregiver about how to determine the difference between the implant and breast tissue. Also, talk about the amount of pressure you should use during the exam. Over time, you will become more familiar with the variations of your breasts and more comfortable with the exam. A breast self-exam requires you to remove all your clothes above the waist. 1. Look at your breasts and nipples.  Stand in front of a mirror in a room with good lighting. With your hands on your hips, push your hands firmly downward. Look for a difference in shape, contour, and size from one breast to the other (asymmetry). Asymmetry includes puckers, dips, or bumps. Also, look for skin changes, such as reddened or scaly areas on the breasts. Look for nipple changes, such as discharge, dimpling, repositioning, or redness. 2. Carefully feel your breasts. This is best done either in the shower or tub while using soapy water or when flat on your back. Place the arm (on the side of the breast you are examining) above your head. Use the pads (not the fingertips) of your three middle fingers on your opposite hand to feel your breasts. Start in the underarm area and use  inch (2 cm) overlapping circles to feel your breast. Use 3 different levels of pressure (light, medium, and firm pressure) at each circle before moving to the next circle. The light pressure is needed to feel the tissue closest to the skin. The medium pressure will help to feel breast tissue a little deeper, while the firm pressure is needed to feel the tissue close to the ribs. Continue the overlapping circles, moving downward over the breast until you feel your ribs below your breast. Then, move one finger-width towards the center of the body. Continue to use the  inch (2 cm) overlapping circles to feel your breast as you move slowly up toward the collar bone (clavicle) near the base of the neck. Continue the up and down exam using all 3 pressures until you reach the   middle of the chest. Do this with each breast, carefully feeling for lumps or changes. 3.  Keep a written record with breast changes or normal findings for each breast. By writing this information down, you do not need to depend only on memory for size, tenderness, or location. Write down where you are in your menstrual cycle, if you are still menstruating. Breast tissue can have some lumps or  thick tissue. However, see your caregiver if you find anything that concerns you.  SEEK MEDICAL CARE IF:  You see a change in shape, contour, or size of your breasts or nipples.   You see skin changes, such as reddened or scaly areas on the breasts or nipples.   You have an unusual discharge from your nipples.   You feel a new lump or unusually thick areas.    This information is not intended to replace advice given to you by your health care provider. Make sure you discuss any questions you have with your health care provider.   Document Released: 08/27/2005 Document Revised: 08/13/2012 Document Reviewed: 12/12/2011 Elsevier Interactive Patient Education 2016 [employer] Inc[employer]  

## 2016-01-03 ENCOUNTER — Telehealth: Payer: Self-pay

## 2016-01-03 LAB — THYROID PANEL WITH TSH
Free Thyroxine Index: 2.3 (ref 1.2–4.9)
T3 Uptake Ratio: 26 % (ref 24–39)
T4, Total: 8.7 ug/dL (ref 4.5–12.0)
TSH: 1.13 u[IU]/mL (ref 0.450–4.500)

## 2016-01-03 LAB — BASIC METABOLIC PANEL
BUN/Creatinine Ratio: 7 — ABNORMAL LOW (ref 9–23)
BUN: 5 mg/dL — AB (ref 6–20)
CALCIUM: 9.5 mg/dL (ref 8.7–10.2)
CO2: 27 mmol/L (ref 18–29)
CREATININE: 0.68 mg/dL (ref 0.57–1.00)
Chloride: 101 mmol/L (ref 96–106)
GFR calc Af Amer: 138 mL/min/{1.73_m2} (ref >59)
GFR, EST NON AFRICAN AMERICAN: 119 mL/min/{1.73_m2} (ref >59)
GLUCOSE: 113 mg/dL — AB (ref 65–99)
Potassium: 3.7 mmol/L (ref 3.5–5.2)
SODIUM: 143 mmol/L (ref 134–144)

## 2016-01-03 NOTE — Telephone Encounter (Signed)
Spoke with patient. Patient advised of all results and verbalized understanding. Will call back with any future questions or concerns. MAH  

## 2016-01-03 NOTE — Telephone Encounter (Signed)
Left message for patient to call back  

## 2016-01-03 NOTE — Telephone Encounter (Signed)
-----   Message from Reubin MilanLaura H Berglund, MD sent at 01/03/2016 11:20 AM EDT ----- Thyroid function panel is normal.  Potassium is normal.

## 2016-01-09 HISTORY — PX: URETHRAL DIVERTICULUM REPAIR: SHX5148

## 2016-01-17 ENCOUNTER — Telehealth: Payer: Self-pay

## 2016-01-17 NOTE — Telephone Encounter (Signed)
Left message about palpitations and chest tightening. Discussed maybe seeing Cardiology and she admits to seeing ER a week ago but is nervous she has procedure with IV sedation next week. I later found that she has seen cardiology. Advised patient to call them back or go to ER per Dr. Asencion PartridgeBergland. Patient seemed uneasy though never revealed why.

## 2016-02-20 ENCOUNTER — Encounter: Payer: Self-pay | Admitting: Internal Medicine

## 2016-02-20 ENCOUNTER — Ambulatory Visit (INDEPENDENT_AMBULATORY_CARE_PROVIDER_SITE_OTHER): Payer: Commercial Managed Care - HMO | Admitting: Internal Medicine

## 2016-02-20 VITALS — BP 122/82 | HR 74 | Temp 98.1°F | Resp 16 | Ht 64.0 in | Wt 153.0 lb

## 2016-02-20 DIAGNOSIS — J40 Bronchitis, not specified as acute or chronic: Secondary | ICD-10-CM | POA: Diagnosis not present

## 2016-02-20 DIAGNOSIS — N361 Urethral diverticulum: Secondary | ICD-10-CM | POA: Insufficient documentation

## 2016-02-20 MED ORDER — DOXYCYCLINE HYCLATE 100 MG PO TABS: 100.0000 mg | ORAL_TABLET | Freq: Two times a day (BID) | ORAL | Status: DC

## 2016-02-20 NOTE — Progress Notes (Signed)
    Date:  02/20/2016   Name:  Sharon Weaver   DOB:  11-Mar-1986   MRN:  [id number]   Chief Complaint: Breathing Problem Breathing Problem She complains of difficulty breathing, hoarse voice, shortness of breath and sputum production. There is no wheezing. This is a new problem. The current episode started 1 to 4 weeks ago. The problem has been unchanged. Associated symptoms include malaise/fatigue. Pertinent negatives include no chest pain, fever, headaches, postnasal drip or trouble swallowing.   She had a urologic procedure in May. She was at home resting for several days afterwards and shortly after that began to feel like there was a little bit of chest heaviness and congestion. She denies lower leg swelling or redness. She has occasional white phlegm production that relieves congestion symptoms. She denies any sinus pressure, sore throat, or ear pain. She feels slightly fatigued at times. Slightly more short of breath with activity. She's never been diagnosed with asthma. She did smoke for several years but quit 9 years ago.   Review of Systems  Constitutional: Positive for malaise/fatigue. Negative for fever and unexpected weight change.  HENT: Positive for hoarse voice. Negative for congestion, postnasal drip, sinus pressure and trouble swallowing.   Respiratory: Positive for sputum production and shortness of breath. Negative for wheezing.   Cardiovascular: Negative for chest pain, palpitations and leg swelling.  Gastrointestinal: Negative for abdominal pain.  Neurological: Negative for dizziness and headaches.    Patient Active Problem List   Diagnosis Date Noted  . 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 Hypokalemia 01/02/2016  . Abnormal TSH 01/02/2016  . Dizziness 01/02/2016  . History of palpitations 12/06/2015  . 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  Cesarean section  09/11/10, 03/10/2013    Social History  Substance Use Topics  . Smoking status: Never Smoker   . Smokeless tobacco: Never Used  . Alcohol Use: 4.8 oz/week    8 Glasses of wine per week     Comment: weekly     Medication list has been reviewed and updated.   Physical Exam  Constitutional: She is oriented to person, place, and time. She appears well-developed. No distress.  HENT:  Head: Normocephalic and atraumatic.  Neck: Normal range of motion. Neck supple. No thyromegaly present.  Cardiovascular: Normal rate, regular rhythm and normal heart sounds.   Pulmonary/Chest: Effort normal. No respiratory distress. She has no decreased breath sounds (bronchial breath sounds upper lungs).  Musculoskeletal: Normal range of motion. She exhibits no edema or tenderness.  Neurological: She is alert and oriented to person, place, and time.  Skin: Skin is warm and dry. No rash noted.  Psychiatric: She has a normal mood and affect. Her behavior is normal. Thought content normal.  Nursing note and vitals reviewed.   BP 122/82 mmHg  Pulse 74  Temp(Src) 98.1 F (36.7 C) (Oral)  Resp 16  Ht 5\' 4"  (1.626 m)  Wt 153 lb (69.4 kg)  BMI 26.25 kg/m2  SpO2 97%  Assessment and Plan: 1. Bronchitis Likely atypical Call for PFTs if no resolution from Doxycyline - doxycycline (VIBRA-TABS) 100 MG tablet; Take 1 tablet (100 mg total) by mouth 2 (two) times daily.  Dispense: 20 tablet; Refill: 0   Bari EdwardLaura Berglund, MD [facility]  02/20/2016

## 2016-02-27 ENCOUNTER — Other Ambulatory Visit: Payer: Self-pay | Admitting: Obstetrics and Gynecology

## 2016-02-27 DIAGNOSIS — N63 Unspecified lump in unspecified breast: Secondary | ICD-10-CM

## 2016-03-07 ENCOUNTER — Ambulatory Visit: Payer: Commercial Managed Care - HMO

## 2016-04-10 ENCOUNTER — Ambulatory Visit
Admission: RE | Admit: 2016-04-10 | Discharge: 2016-04-10 | Disposition: A | Discharge status: Home / Self Care | Payer: Commercial Managed Care - HMO | Source: Ambulatory Visit | Attending: Obstetrics and Gynecology | Admitting: Obstetrics and Gynecology

## 2016-04-10 DIAGNOSIS — N63 Unspecified lump in unspecified breast: Secondary | ICD-10-CM

## 2016-04-17 ENCOUNTER — Encounter: Payer: Self-pay | Admitting: Internal Medicine

## 2016-04-17 ENCOUNTER — Ambulatory Visit (INDEPENDENT_AMBULATORY_CARE_PROVIDER_SITE_OTHER): Payer: Commercial Managed Care - HMO | Admitting: Internal Medicine

## 2016-04-17 VITALS — BP 121/82 | HR 76 | Temp 98.2°F | Resp 16 | Ht 64.0 in | Wt 152.4 lb

## 2016-04-17 DIAGNOSIS — M6248 Contracture of muscle, other site: Secondary | ICD-10-CM | POA: Diagnosis not present

## 2016-04-17 DIAGNOSIS — R42 Dizziness and giddiness: Secondary | ICD-10-CM

## 2016-04-17 DIAGNOSIS — J0100 Acute maxillary sinusitis, unspecified: Secondary | ICD-10-CM | POA: Diagnosis not present

## 2016-04-17 DIAGNOSIS — M62838 Other muscle spasm: Secondary | ICD-10-CM

## 2016-04-17 MED ORDER — METHOCARBAMOL 500 MG PO TABS
500.0000 mg | ORAL_TABLET | Freq: Every day | ORAL | 0 refills | Status: DC
Start: 2016-04-17 — End: 2016-07-17

## 2016-04-17 MED ORDER — AMOXICILLIN-POT CLAVULANATE 875-125 MG PO TABS: 1.0000 | ORAL_TABLET | Freq: Two times a day (BID) | ORAL | 0 refills | Status: DC

## 2016-04-17 NOTE — Progress Notes (Signed)
Date:  04/17/2016   Name:  Sharon Weaver   DOB:  [birth year]   MRN:  [id number]   Chief Complaint: Facial Pain (? Sinus issues has appt with Jueng[facility] Mon 345 to discuss Dizziness. ); Neck Pain (1 week); and Headache (1 week) Neck Pain   This is a new problem. The current episode started in the past 7 days. The problem occurs constantly. The pain is present in the left side. The pain is mild. Associated symptoms include headaches. Pertinent negatives include no chest pain or fever. She has tried acetaminophen and NSAIDs for the symptoms.  Headache   This is a new problem. The current episode started in the past 7 days. The problem has been unchanged. The pain is located in the frontal region. The quality of the pain is described as aching. Associated symptoms include dizziness, neck pain and sinus pressure. Pertinent negatives include no back pain, ear pain, fever, sore throat or tinnitus.      Review of Systems  Constitutional: Negative for chills and fever.  HENT: Positive for congestion and sinus pressure. Negative for ear pain, sore throat and tinnitus.   Respiratory: Negative for chest tightness, shortness of breath and stridor.   Cardiovascular: Negative for chest pain.  Musculoskeletal: Positive for neck pain. Negative for arthralgias, back pain and neck stiffness.  Neurological: Positive for dizziness and headaches. Negative for tremors and syncope.    Patient Active Problem List   Diagnosis Date Noted  . 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 Hypokalemia 01/02/2016  . Abnormal TSH 01/02/2016  . Dizziness 01/02/2016  . History of palpitations 12/06/2015  . Disequilibrium 12/06/2015  . 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 CESAREAN SECTION  09/11/10, 03/10/2013  . URETHRAL DIVERTICULUM REPAIR  01/2016    Social History  Substance Use Topics  . Smoking  status: Never Smoker  . Smokeless tobacco: Never Used  . Alcohol use 4.8 oz/week    8 Glasses of wine per week     Comment: weekly     Medication list has been reviewed and updated.   Physical Exam  Constitutional: She is oriented to person, place, and time. She appears well-developed. No distress.  HENT:  Head: Normocephalic and atraumatic.  Right Ear: Tympanic membrane and ear canal normal.  Left Ear: Tympanic membrane and ear canal normal.  Nose: Right sinus exhibits no maxillary sinus tenderness and no frontal sinus tenderness. Left sinus exhibits maxillary sinus tenderness and frontal sinus tenderness.  Mouth/Throat: No posterior oropharyngeal edema or posterior oropharyngeal erythema.  Neck: Normal range of motion. Neck supple.  Cardiovascular: Normal rate, regular rhythm and normal heart sounds.   Pulmonary/Chest: Effort normal and breath sounds normal. No respiratory distress.  Musculoskeletal: Normal range of motion.  Mild spasm and tenderness of the trapezius muscles bilaterally. Mildly abnormal TMJ excursions bilaterally  Neurological: She is alert and oriented to person, place, and time.  Skin: Skin is warm and dry. No rash noted.  Psychiatric: She has a normal mood and affect. Her behavior is normal. Thought content normal.  Nursing note and vitals reviewed.   BP 121/82 (BP Location: Right Arm, Patient Position: Sitting, Cuff Size: Normal)   Pulse 76   Temp 98.2 F (36.8 C) (Oral)   Resp 16   Ht 5\' 4"  (1.626 m)   Wt 152 lb 6.4 oz (69.1 kg)  LMP 03/31/2016   SpO2 100%   BMI 26.16 kg/m   Assessment and Plan: 1. Acute maxillary sinusitis, recurrence not specified Follow up with Dr. Delfin Edis as scheduled for evaluation of dizziness - amoxicillin-clavulanate (AUGMENTIN) 875-125 MG tablet; Take 1 tablet by mouth 2 (two) times daily.  Dispense: 20 tablet; Refill: 0  2. 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  Dispense: 30 tablet; Refill: 0  3. Dizziness Follow up with ENT  Bari Edward, MD [facility]  04/17/2016

## 2016-04-17 NOTE — Patient Instructions (Signed)

## 2016-05-07 ENCOUNTER — Other Ambulatory Visit: Payer: Self-pay | Admitting: Otolaryngology

## 2016-05-07 DIAGNOSIS — R42 Dizziness and giddiness: Secondary | ICD-10-CM

## 2016-05-16 ENCOUNTER — Ambulatory Visit
Admission: RE | Admit: 2016-05-16 | Discharge: 2016-05-16 | Disposition: A | Discharge status: Home / Self Care | Payer: Commercial Managed Care - HMO | Source: Ambulatory Visit | Attending: Otolaryngology | Admitting: Otolaryngology

## 2016-05-16 DIAGNOSIS — R42 Dizziness and giddiness: Secondary | ICD-10-CM | POA: Diagnosis not present

## 2016-05-16 MED ORDER — GADOBENATE DIMEGLUMINE 529 MG/ML IV SOLN
14.0000 mL | Freq: Once | INTRAVENOUS | Status: AC | PRN
  Administered 2016-05-16: 14 mL via INTRAVENOUS

## 2016-06-11 DIAGNOSIS — I471 Supraventricular tachycardia: Secondary | ICD-10-CM | POA: Insufficient documentation

## 2016-07-17 ENCOUNTER — Ambulatory Visit (INDEPENDENT_AMBULATORY_CARE_PROVIDER_SITE_OTHER): Payer: Commercial Managed Care - HMO | Admitting: Internal Medicine

## 2016-07-17 ENCOUNTER — Encounter: Payer: Self-pay | Admitting: Internal Medicine

## 2016-07-17 ENCOUNTER — Ambulatory Visit: Payer: Commercial Managed Care - HMO | Admitting: Internal Medicine

## 2016-07-17 DIAGNOSIS — Z87898 Personal history of other specified conditions: Secondary | ICD-10-CM | POA: Diagnosis not present

## 2016-07-17 DIAGNOSIS — J029 Acute pharyngitis, unspecified: Secondary | ICD-10-CM

## 2016-07-17 DIAGNOSIS — E538 Deficiency of other specified B group vitamins: Secondary | ICD-10-CM | POA: Diagnosis not present

## 2016-07-17 MED ORDER — AMOXICILLIN 875 MG PO TABS: 875.0000 mg | ORAL_TABLET | Freq: Two times a day (BID) | ORAL | 0 refills | Status: DC

## 2016-07-17 NOTE — Progress Notes (Signed)
Date:  07/17/2016   Name:  Sharon Weaver   DOB:  1986/08/17   MRN:  [id number]   Chief Complaint: Sore Throat (Started 4 days ago. Swollen on both sides of neck. Drainage but not production. Hurts when swallows. Low grade fever 3 days ago.) Taking advil but no over the counter medications due to tachycardia.    Review of Systems  Constitutional: Negative for appetite change, fatigue, fever and unexpected weight change.  HENT: Positive for postnasal drip and sore throat. Negative for tinnitus and trouble swallowing.   Eyes: Negative for visual disturbance.  Respiratory: Negative for cough, chest tightness and shortness of breath.   Cardiovascular: Positive for palpitations. Negative for chest pain and leg swelling.  Gastrointestinal: Negative for abdominal pain.  Endocrine: Negative for polydipsia and polyuria.  Genitourinary: Negative for dysuria and hematuria.  Musculoskeletal: Negative for arthralgias.  Neurological: Positive for dizziness. Negative for tremors, numbness and headaches.  Psychiatric/Behavioral: Negative for dysphoric mood.    Patient Active Problem List   Diagnosis Date Noted  . 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 Hypokalemia 01/02/2016  . Abnormal TSH 01/02/2016  . Dizziness 01/02/2016  . History of palpitations 12/06/2015  . Disequilibrium 12/06/2015  . 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 Authorizing Provider  methocarbamol (ROBAXIN) 500 MG tablet Take 1 tablet (500 mg total) by mouth at bedtime. Patient not taking: Reported on 07/17/2016 04/17/16   Reubin MilanLaura H Romen Yutzy, MD    No Known Allergies  Past Surgical History:  Procedure Laterality Date  . CESAREAN SECTION  09/11/10, 03/10/2013  . URETHRAL DIVERTICULUM REPAIR  01/2016    Social History  Substance Use Topics  . Smoking status: Former Games developermoker  . Smokeless tobacco: Never Used  . Alcohol use 4.8 oz/week    8 Glasses of wine  per week     Comment: weekly     Medication list has been reviewed and updated.   Physical Exam  Constitutional: She is oriented to person, place, and time. She appears well-developed. No distress.  HENT:  Head: Normocephalic and atraumatic.  Right Ear: Ear canal normal. Tympanic membrane is retracted. Tympanic membrane is not erythematous.  Left Ear: Ear canal normal. Tympanic membrane is retracted. Tympanic membrane is not erythematous.  Nose: Right sinus exhibits no maxillary sinus tenderness. Left sinus exhibits no maxillary sinus tenderness.  Mouth/Throat: Posterior oropharyngeal erythema (and mucoid drainage) present.  Neck: Normal range of motion. Neck supple.  Cardiovascular: Normal rate, regular rhythm and normal heart sounds.   Pulmonary/Chest: Effort normal and breath sounds normal. No respiratory distress.  Musculoskeletal: Normal range of motion.  Lymphadenopathy:    She has cervical adenopathy.  Neurological: She is alert and oriented to person, place, and time.  Skin: Skin is warm and dry. No rash noted.  Psychiatric: She has a normal mood and affect. Her behavior is normal. Thought content normal.  Nursing note and vitals reviewed.   BP 122/78   Pulse 91   Temp 98.3 F (36.8 C)   Ht 5\' 4"  (1.626 m)   Wt 151 lb (68.5 kg)   SpO2 100%   BMI 25.92 kg/m   Assessment and Plan: 1. Pharyngitis, unspecified etiology Continue advil, resume flonase for ear congestion - amoxicillin (AMOXIL) 875 MG tablet; Take 1 tablet (875 mg total) by mouth 2 (two) times daily.  Dispense: 20 tablet; Refill: 0  2. History of palpitations Improved - followed by cardiology  3. B12 nutritional deficiency Seen by Neurology - may be contributing to dizziness   Bari EdwardLaura Evadne Ose, MD [facility]  07/17/2016

## 2016-08-17 LAB — OB RESULTS CONSOLE ABO/RH: RH TYPE: POSITIVE

## 2016-08-17 LAB — OB RESULTS CONSOLE HEPATITIS B SURFACE ANTIGEN: HEP B S AG: NEGATIVE

## 2016-08-17 LAB — OB RESULTS CONSOLE HGB/HCT, BLOOD
HCT: 38 %
HEMOGLOBIN: 12.2 g/dL

## 2016-08-17 LAB — OB RESULTS CONSOLE PLATELET COUNT: Platelets: 166 10*3/uL

## 2016-08-17 LAB — OB RESULTS CONSOLE ANTIBODY SCREEN: Antibody Screen: NEGATIVE

## 2016-08-17 LAB — OB RESULTS CONSOLE RPR: RPR: NONREACTIVE

## 2016-08-17 LAB — OB RESULTS CONSOLE HIV ANTIBODY (ROUTINE TESTING): HIV: NONREACTIVE

## 2016-08-17 LAB — OB RESULTS CONSOLE VARICELLA ZOSTER ANTIBODY, IGG: VARICELLA IGG: IMMUNE

## 2016-08-17 LAB — OB RESULTS CONSOLE RUBELLA ANTIBODY, IGM: RUBELLA: IMMUNE

## 2016-08-17 LAB — OB RESULTS CONSOLE GC/CHLAMYDIA
CHLAMYDIA, DNA PROBE: NEGATIVE
GC PROBE AMP, GENITAL: NEGATIVE

## 2016-10-05 DIAGNOSIS — E538 Deficiency of other specified B group vitamins: Secondary | ICD-10-CM | POA: Diagnosis not present

## 2016-10-05 DIAGNOSIS — E559 Vitamin D deficiency, unspecified: Secondary | ICD-10-CM | POA: Diagnosis not present

## 2016-10-23 DIAGNOSIS — G43109 Migraine with aura, not intractable, without status migrainosus: Secondary | ICD-10-CM | POA: Diagnosis not present

## 2016-10-23 DIAGNOSIS — E538 Deficiency of other specified B group vitamins: Secondary | ICD-10-CM | POA: Diagnosis not present

## 2016-11-13 DIAGNOSIS — E538 Deficiency of other specified B group vitamins: Secondary | ICD-10-CM | POA: Diagnosis not present

## 2016-12-03 ENCOUNTER — Ambulatory Visit (INDEPENDENT_AMBULATORY_CARE_PROVIDER_SITE_OTHER): Payer: Commercial Managed Care - HMO | Admitting: Obstetrics and Gynecology

## 2016-12-03 ENCOUNTER — Encounter: Payer: Self-pay | Admitting: Obstetrics and Gynecology

## 2016-12-03 VITALS — BP 118/74 | Wt 161.0 lb

## 2016-12-03 DIAGNOSIS — O0993 Supervision of high risk pregnancy, unspecified, third trimester: Secondary | ICD-10-CM | POA: Insufficient documentation

## 2016-12-03 DIAGNOSIS — Z131 Encounter for screening for diabetes mellitus: Secondary | ICD-10-CM

## 2016-12-03 DIAGNOSIS — O34219 Maternal care for unspecified type scar from previous cesarean delivery: Secondary | ICD-10-CM

## 2016-12-03 DIAGNOSIS — Z113 Encounter for screening for infections with a predominantly sexual mode of transmission: Secondary | ICD-10-CM

## 2016-12-03 DIAGNOSIS — Z3A24 24 weeks gestation of pregnancy: Secondary | ICD-10-CM

## 2016-12-03 DIAGNOSIS — O0992 Supervision of high risk pregnancy, unspecified, second trimester: Secondary | ICD-10-CM

## 2016-12-03 NOTE — Progress Notes (Signed)
No vb. No lof. Long discussion regard TOLAC after 2 c-sections

## 2016-12-06 DIAGNOSIS — N368 Other specified disorders of urethra: Secondary | ICD-10-CM | POA: Insufficient documentation

## 2016-12-10 DIAGNOSIS — I493 Ventricular premature depolarization: Secondary | ICD-10-CM | POA: Insufficient documentation

## 2016-12-10 DIAGNOSIS — I471 Supraventricular tachycardia: Secondary | ICD-10-CM | POA: Diagnosis not present

## 2016-12-25 ENCOUNTER — Ambulatory Visit (INDEPENDENT_AMBULATORY_CARE_PROVIDER_SITE_OTHER): Payer: Commercial Managed Care - HMO | Admitting: Obstetrics and Gynecology

## 2016-12-25 ENCOUNTER — Other Ambulatory Visit: Payer: Commercial Managed Care - HMO

## 2016-12-25 VITALS — BP 116/70 | Wt 166.0 lb

## 2016-12-25 DIAGNOSIS — Z131 Encounter for screening for diabetes mellitus: Secondary | ICD-10-CM | POA: Diagnosis not present

## 2016-12-25 DIAGNOSIS — O0993 Supervision of high risk pregnancy, unspecified, third trimester: Secondary | ICD-10-CM

## 2016-12-25 DIAGNOSIS — Z113 Encounter for screening for infections with a predominantly sexual mode of transmission: Secondary | ICD-10-CM

## 2016-12-25 DIAGNOSIS — O34219 Maternal care for unspecified type scar from previous cesarean delivery: Secondary | ICD-10-CM

## 2016-12-25 DIAGNOSIS — O0992 Supervision of high risk pregnancy, unspecified, second trimester: Secondary | ICD-10-CM

## 2016-12-25 DIAGNOSIS — Z3A27 27 weeks gestation of pregnancy: Secondary | ICD-10-CM

## 2016-12-25 NOTE — Progress Notes (Signed)
28 week labs today. No vb. No lof. Discussed TOLAC today, still wants this vs RCS

## 2016-12-26 LAB — 28 WEEK RH+PANEL
BASOS ABS: 0 10*3/uL (ref 0.0–0.2)
Basos: 0 %
EOS (ABSOLUTE): 0 10*3/uL (ref 0.0–0.4)
Eos: 0 %
GESTATIONAL DIABETES SCREEN: 56 mg/dL — AB (ref 65–139)
HEMATOCRIT: 34.3 % (ref 34.0–46.6)
HIV SCREEN 4TH GENERATION: NONREACTIVE
Hemoglobin: 11.3 g/dL (ref 11.1–15.9)
Immature Grans (Abs): 0.1 10*3/uL (ref 0.0–0.1)
Immature Granulocytes: 1 %
LYMPHS ABS: 1.6 10*3/uL (ref 0.7–3.1)
Lymphs: 19 %
MCH: 29.3 pg (ref 26.6–33.0)
MCHC: 32.9 g/dL (ref 31.5–35.7)
MCV: 89 fL (ref 79–97)
Monocytes Absolute: 0.6 10*3/uL (ref 0.1–0.9)
Monocytes: 7 %
NEUTROS ABS: 6.2 10*3/uL (ref 1.4–7.0)
Neutrophils: 73 %
PLATELETS: 161 10*3/uL (ref 150–379)
RBC: 3.86 x10E6/uL (ref 3.77–5.28)
RDW: 14.5 % (ref 12.3–15.4)
RPR: NONREACTIVE
WBC: 8.5 10*3/uL (ref 3.4–10.8)

## 2017-01-09 ENCOUNTER — Ambulatory Visit (INDEPENDENT_AMBULATORY_CARE_PROVIDER_SITE_OTHER): Payer: Commercial Managed Care - HMO | Admitting: Obstetrics and Gynecology

## 2017-01-09 VITALS — BP 122/60 | Wt 170.0 lb

## 2017-01-09 DIAGNOSIS — Z3A29 29 weeks gestation of pregnancy: Secondary | ICD-10-CM

## 2017-01-09 DIAGNOSIS — Z3483 Encounter for supervision of other normal pregnancy, third trimester: Secondary | ICD-10-CM

## 2017-01-09 NOTE — Progress Notes (Signed)
TDAP nv

## 2017-01-23 ENCOUNTER — Ambulatory Visit (INDEPENDENT_AMBULATORY_CARE_PROVIDER_SITE_OTHER): Payer: Commercial Managed Care - HMO | Admitting: Obstetrics and Gynecology

## 2017-01-23 VITALS — BP 102/62 | HR 76 | Wt 171.0 lb

## 2017-01-23 DIAGNOSIS — O0993 Supervision of high risk pregnancy, unspecified, third trimester: Secondary | ICD-10-CM

## 2017-01-23 DIAGNOSIS — Z23 Encounter for immunization: Secondary | ICD-10-CM | POA: Diagnosis not present

## 2017-01-23 DIAGNOSIS — Z3A31 31 weeks gestation of pregnancy: Secondary | ICD-10-CM

## 2017-01-23 DIAGNOSIS — E538 Deficiency of other specified B group vitamins: Secondary | ICD-10-CM | POA: Diagnosis not present

## 2017-01-23 DIAGNOSIS — G43109 Migraine with aura, not intractable, without status migrainosus: Secondary | ICD-10-CM | POA: Diagnosis not present

## 2017-01-23 NOTE — Progress Notes (Signed)
Pos PNVs. No VB, LOF, Doing well. Breast/PPTL.

## 2017-01-23 NOTE — Progress Notes (Signed)
Pt reports no problems. tdap and blood consent today.

## 2017-02-06 ENCOUNTER — Ambulatory Visit (INDEPENDENT_AMBULATORY_CARE_PROVIDER_SITE_OTHER): Payer: Commercial Managed Care - HMO | Admitting: Advanced Practice Midwife

## 2017-02-06 VITALS — BP 112/58 | Wt 175.0 lb

## 2017-02-06 DIAGNOSIS — Z3A33 33 weeks gestation of pregnancy: Secondary | ICD-10-CM

## 2017-02-06 NOTE — Progress Notes (Signed)
Doing well. No questions or concerns. No LOF, VB. Brief discussion of TOLAC vs repeat C/S. Still planning for TOLAC and needs to sign consent.

## 2017-02-06 NOTE — Progress Notes (Signed)
No concerns 34 wk instructions given

## 2017-02-06 NOTE — Patient Instructions (Signed)
Trial of Labor After Cesarean Delivery A trial of labor after cesarean delivery (TOLAC) is when a woman tries to give birth vaginally after a previous cesarean delivery. TOLAC may be a safe and appropriate option for you depending on your medical history and other risk factors. When TOLAC is successful and you are able to have a vaginal delivery, this is called a vaginal birth after cesarean delivery (VBAC). Candidates for TOLAC TOLAC is possible for some women who:  Have undergone one or two prior cesarean deliveries in which the incision of the uterus was horizontal (low transverse).  Are carrying twins and have had one prior low transverse incision during a cesarean delivery.  Do not have a vertical (classical) uterine scar.  Have not had a tear in the wall of their uterus (uterine rupture). TOLAC is also supported for women who meet appropriate criteria and:  Are under the age of 40 years.  Are tall and have a body mass index (BMI) of less than 30.  Have an unknown uterine scar.  Give birth in a facility equipped to handle an emergency cesarean delivery. This team should be able to handle possible complications such as a uterine rupture.  Have thorough counseling about the benefits and risks of TOLAC.  Have discussed future pregnancy plans with their health care provider.  Plan to have several more pregnancies. Most successful candidates for TOLAC:  Have had a successful vaginal delivery before or after their cesarean delivery.  Experience labor that begins naturally on or before the due date (40 weeks of gestation).  Do not have a very large (macrosomic) baby.  Had a prior cesarean delivery but are not currently experiencing factors that would prompt a cesarean delivery (such as a breech position).  Had only one prior cesarean delivery.  Had a prior cesarean delivery that was performed early in labor and not after full cervical dilation. TOLAC may be most appropriate for  women who meet the above guidelines and who plan to have more pregnancies. TOLAC is not recommended for home births. Least successful candidates for TOLAC:  Have an induced labor with an unfavorable cervix. An unfavorable cervix is when the cervix is not dilating enough (among other factors).  Have never had a vaginal delivery.  Have had more than two cesarean deliveries.  Have a pregnancy at more than 40 weeks of gestation.  Are pregnant with a baby with a suspected weight greater than 4,000 grams (8 pounds) and who have no prior history of a vaginal delivery.  Have closely spaced pregnancies. Suggested benefits of TOLAC  You may have a faster recovery time.  You may have a shorter stay in the hospital.  You may have less pain and fewer problems than with a cesarean delivery. Women who have a cesarean delivery have a higher chance of needing blood or getting a fever, an infection, or a blood clot in the legs. Suggested risks of TOLAC The highest risk of complications happens to women who attempt a TOLAC and fail. A failed TOLAC results in an unplanned cesarean delivery. Risks related to [facility] or repeat cesarean deliveries include:  Blood loss.  Infection.  Blood clot.  Injury to surrounding tissues or organs.  Having to remove the uterus (hysterectomy).  Potential problems with the placenta (such as placenta previa or placenta accreta) in future pregnancies. Although very rare, the main concerns with TOLAC are:  Rupture of the uterine scar from a past cesarean delivery.  Needing an emergency cesarean delivery.  Having a bad outcome for the baby (perinatal morbidity). Where to find more information:  American Congress of Obstetricians and Gynecologists: www.acog.org  [employer] of Nurse-Midwives: www.midwife.org This information is not intended to replace advice given to you by your health care provider. Make sure you discuss any questions you have with your  health care provider. Document Released: 05/15/2011 Document Revised: 07/25/2016 Document Reviewed: 02/16/2013 Elsevier Interactive Patient Education  2017 [employer].

## 2017-02-20 ENCOUNTER — Ambulatory Visit (INDEPENDENT_AMBULATORY_CARE_PROVIDER_SITE_OTHER): Payer: Commercial Managed Care - HMO | Admitting: Obstetrics and Gynecology

## 2017-02-20 ENCOUNTER — Telehealth: Payer: Self-pay | Admitting: Obstetrics & Gynecology

## 2017-02-20 VITALS — BP 116/74 | Wt 181.0 lb

## 2017-02-20 DIAGNOSIS — O0993 Supervision of high risk pregnancy, unspecified, third trimester: Secondary | ICD-10-CM

## 2017-02-20 DIAGNOSIS — O34219 Maternal care for unspecified type scar from previous cesarean delivery: Secondary | ICD-10-CM

## 2017-02-20 DIAGNOSIS — Z3A35 35 weeks gestation of pregnancy: Secondary | ICD-10-CM

## 2017-02-20 NOTE — Progress Notes (Signed)
No vb. No lof. Still would like TOLAC p 2 c-section. Heartburn managed with milk and mustard. Signed VBAC (TOLAC) form today. Scheduled c-section for 41 weeks. (7/19 with [facility]). Discussed increased risks even about a normal TOLAC, including fetal death.

## 2017-02-20 NOTE — Telephone Encounter (Signed)
Patient is aware of H&P on 03/27/17 @ 8:40am and Pre-admit Testing afterwards, and OR on  03/28/17.

## 2017-02-27 ENCOUNTER — Ambulatory Visit (INDEPENDENT_AMBULATORY_CARE_PROVIDER_SITE_OTHER): Payer: Commercial Managed Care - HMO | Admitting: Obstetrics & Gynecology

## 2017-02-27 VITALS — BP 110/70 | Wt 181.0 lb

## 2017-02-27 DIAGNOSIS — O0993 Supervision of high risk pregnancy, unspecified, third trimester: Secondary | ICD-10-CM

## 2017-02-27 DIAGNOSIS — Z3A36 36 weeks gestation of pregnancy: Secondary | ICD-10-CM

## 2017-02-27 DIAGNOSIS — O34219 Maternal care for unspecified type scar from previous cesarean delivery: Secondary | ICD-10-CM

## 2017-02-27 DIAGNOSIS — Z7689 Persons encountering health services in other specified circumstances: Secondary | ICD-10-CM | POA: Diagnosis not present

## 2017-02-27 NOTE — Patient Instructions (Signed)
Vaginal Birth After Cesarean Delivery Vaginal birth after cesarean delivery (VBAC) is giving birth vaginally after previously delivering a baby by a cesarean. In the past, if a woman had a cesarean delivery, all births afterward would be done by cesarean delivery. This is no longer true. It can be safe for the mother to try a vaginal delivery after having a cesarean delivery. It is important to discuss VBAC with your health care provider early in the pregnancy so you can understand the risks, benefits, and options. It will give you time to decide what is best in your particular case. The final decision about whether to have a VBAC or repeat cesarean delivery should be between you and your health care provider. Any changes in your health or your baby's health during your pregnancy may make it necessary to change your initial decision about VBAC. Women who plan to have a VBAC should check with their health care provider to be sure that:  The previous cesarean delivery was done with a low transverse uterine cut (incision) (not a vertical classical incision).  The birth canal is big enough for the baby.  There were no other operations on the uterus.  An electronic fetal monitor (EFM) will be on at all times during labor.  An operating room will be available and ready in case an emergency cesarean delivery is needed.  A health care provider and surgical nursing staff will be available at all times during labor to be ready to do an emergency delivery cesarean if necessary.  An anesthesiologist will be present in case an emergency cesarean delivery is needed.  The nursery is prepared and has adequate personnel and necessary equipment available to care for the baby in case of an emergency cesarean delivery. Benefits of VBAC  Shorter stay in the hospital.  Avoidance of risks associated with cesarean delivery, such as: ? Surgical complications, such as opening of the incision or hernia in the  incision. ? Injury to other organs. ? Fever. This can occur if an infection develops after surgery. It can also occur as a reaction to the medicine given to make you numb during the surgery.  Less blood loss and need for blood transfusions.  Lower risk of blood clots and infection.  Shorter recovery.  Decreased risk for having to remove the uterus (hysterectomy).  Decreased risk for the placenta to completely or partially cover the opening of the uterus (placenta previa) with a future pregnancy.  Decrease risk in future labor and delivery. Risks of a VBAC  Tearing (rupture) of the uterus. This is occurs in less than 1% of VBACs. The risk of this happening is higher if: ? Steps are taken to begin the labor process (induce labor) or stimulate or strengthen contractions (augment labor). ? Medicine is used to soften (ripen) the cervix.  Having to remove the uterus (hysterectomy) if it ruptures. VBAC should not be done if:  The previous cesarean delivery was done with a vertical (classical) or T-shaped incision or you do not know what kind of incision was made.  You had a ruptured uterus.  You have had certain types of surgery on your uterus, such as removal of uterine fibroids. Ask your health care provider about other types of surgeries that prevent you from having a VBAC.  You have certain medical or childbirth (obstetrical) problems.  There are problems with the baby.  You have had two previous cesarean deliveries and no vaginal deliveries. Other facts to know about VBAC:  It   is safe to have an epidural anesthetic with VBAC.  It is safe to turn the baby from a breech position (attempt an external cephalic version).  It is safe to try a VBAC with twins.  VBAC may not be successful if your baby weights 8.8 lb (4 kg) or more. However, weight predictions are not always accurate and should not be used alone to decide if VBAC is right for you.  There is an increased failure rate  if the time between the cesarean delivery and VBAC is less than 19 months.  Your health care provider may advise against a VBAC if you have preeclampsia (high blood pressure, protein in the urine, and swelling of face and extremities).  VBAC is often successful if you previously gave birth vaginally.  VBAC is often successful when the labor starts spontaneously before the due date.  Delivering a baby through a VBAC is similar to having a normal spontaneous vaginal delivery. This information is not intended to replace advice given to you by your health care provider. Make sure you discuss any questions you have with your health care provider. Document Released: 02/17/2007 Document Revised: 02/02/2016 Document Reviewed: 03/26/2013 Elsevier Interactive Patient Education  2018 [employer] Inc.  

## 2017-02-27 NOTE — Progress Notes (Signed)
VBAC and CS and BTL discussed today. PNV, FMC, labor precautions, GBS done.

## 2017-03-03 LAB — CULTURE, BETA STREP (GROUP B ONLY): Strep Gp B Culture: NEGATIVE

## 2017-03-08 ENCOUNTER — Ambulatory Visit (INDEPENDENT_AMBULATORY_CARE_PROVIDER_SITE_OTHER): Payer: Self-pay | Admitting: Obstetrics and Gynecology

## 2017-03-08 VITALS — BP 118/74 | Wt 182.0 lb

## 2017-03-08 DIAGNOSIS — O0993 Supervision of high risk pregnancy, unspecified, third trimester: Secondary | ICD-10-CM

## 2017-03-08 DIAGNOSIS — Z3A37 37 weeks gestation of pregnancy: Secondary | ICD-10-CM

## 2017-03-08 DIAGNOSIS — O34219 Maternal care for unspecified type scar from previous cesarean delivery: Secondary | ICD-10-CM

## 2017-03-08 NOTE — Progress Notes (Signed)
No bv. No lof.  Still wants to do TOLAC w backup c-section planned. States that she read the VBAC consent I gave to her and wants to proceed.

## 2017-03-15 ENCOUNTER — Ambulatory Visit (INDEPENDENT_AMBULATORY_CARE_PROVIDER_SITE_OTHER): Payer: 59 | Admitting: Advanced Practice Midwife

## 2017-03-15 VITALS — BP 114/76 | Wt 182.0 lb

## 2017-03-15 DIAGNOSIS — Z3A38 38 weeks gestation of pregnancy: Secondary | ICD-10-CM

## 2017-03-15 NOTE — Patient Instructions (Signed)
Vaginal Delivery Vaginal delivery means that you will give birth by pushing your baby out of your birth canal (vagina). A team of health care providers will help you before, during, and after vaginal delivery. Birth experiences are unique for every woman and every pregnancy, and birth experiences vary depending on where you choose to give birth. What should I do to prepare for my baby's birth? Before your baby is born, it is important to talk with your health care provider about:  Your labor and delivery preferences. These may include: ? Medicines that you may be given. ? How you will manage your pain. This might include non-medical pain relief techniques or injectable pain relief such as epidural analgesia. ? How you and your baby will be monitored during labor and delivery. ? Who may be in the labor and delivery room with you. ? Your feelings about surgical delivery of your baby (cesarean delivery, or C-section) if this becomes necessary. ? Your feelings about receiving donated blood through an IV tube (blood transfusion) if this becomes necessary.  Whether you are able: ? To take pictures or videos of the birth. ? To eat during labor and delivery. ? To move around, walk, or change positions during labor and delivery.  What to expect after your baby is born, such as: ? Whether delayed umbilical cord clamping and cutting is offered. ? Who will care for your baby right after birth. ? Medicines or tests that may be recommended for your baby. ? Whether breastfeeding is supported in your hospital or birth center. ? How long you will be in the hospital or birth center.  How any medical conditions you have may affect your baby or your labor and delivery experience.  To prepare for your baby's birth, you should also:  Attend all of your health care visits before delivery (prenatal visits) as recommended by your health care provider. This is important.  Prepare your home for your baby's  arrival. Make sure that you have: ? Diapers. ? Baby clothing. ? Feeding equipment. ? Safe sleeping arrangements for you and your baby.  Install a car seat in your vehicle. Have your car seat checked by a certified car seat installer to make sure that it is installed safely.  Think about who will help you with your new baby at home for at least the first several weeks after delivery.  What can I expect when I arrive at the birth center or hospital? Once you are in labor and have been admitted into the hospital or birth center, your health care provider may:  Review your pregnancy history and any concerns you have.  Insert an IV tube into one of your veins. This is used to give you fluids and medicines.  Check your blood pressure, pulse, temperature, and heart rate (vital signs).  Check whether your bag of water (amniotic sac) has broken (ruptured).  Talk with you about your birth plan and discuss pain control options.  Monitoring Your health care provider may monitor your contractions (uterine monitoring) and your baby's heart rate (fetal monitoring). You may need to be monitored:  Often, but not continuously (intermittently).  All the time or for long periods at a time (continuously). Continuous monitoring may be needed if: ? You are taking certain medicines, such as medicine to relieve pain or make your contractions stronger. ? You have pregnancy or labor complications.  Monitoring may be done by:  Placing a special stethoscope or a handheld monitoring device on your abdomen to   check your baby's heartbeat, and feeling your abdomen for contractions. This method of monitoring does not continuously record your baby's heartbeat or your contractions.  Placing monitors on your abdomen (external monitors) to record your baby's heartbeat and the frequency and length of contractions. You may not have to wear external monitors all the time.  Placing monitors inside of your uterus  (internal monitors) to record your baby's heartbeat and the frequency, length, and strength of your contractions. ? Your health care provider may use internal monitors if he or she needs more information about the strength of your contractions or your baby's heart rate. ? Internal monitors are put in place by passing a thin, flexible wire through your vagina and into your uterus. Depending on the type of monitor, it may remain in your uterus or on your baby's head until birth. ? Your health care provider will discuss the benefits and risks of internal monitoring with you and will ask for your permission before inserting the monitors.  Telemetry. This is a type of continuous monitoring that can be done with external or internal monitors. Instead of having to stay in bed, you are able to move around during telemetry. Ask your health care provider if telemetry is an option for you.  Physical exam Your health care provider may perform a physical exam. This may include:  Checking whether your baby is positioned: ? With the head toward your vagina (head-down). This is most common. ? With the head toward the top of your uterus (head-up or breech). If your baby is in a breech position, your health care provider may try to turn your baby to a head-down position so you can deliver vaginally. If it does not seem that your baby can be born vaginally, your provider may recommend surgery to deliver your baby. In rare cases, you may be able to deliver vaginally if your baby is head-up (breech delivery). ? Lying sideways (transverse). Babies that are lying sideways cannot be delivered vaginally.  Checking your cervix to determine: ? Whether it is thinning out (effacing). ? Whether it is opening up (dilating). ? How low your baby has moved into your birth canal.  What are the three stages of labor and delivery?  Normal labor and delivery is divided into the following three stages: Stage 1  Stage 1 is the  longest stage of labor, and it can last for hours or days. Stage 1 includes: ? Early labor. This is when contractions may be irregular, or regular and mild. Generally, early labor contractions are more than 10 minutes apart. ? Active labor. This is when contractions get longer, more regular, more frequent, and more intense. ? The transition phase. This is when contractions happen very close together, are very intense, and may last longer than during any other part of labor.  Contractions generally feel mild, infrequent, and irregular at first. They get stronger, more frequent (about every 2-3 minutes), and more regular as you progress from early labor through active labor and transition.  Many women progress through stage 1 naturally, but you may need help to continue making progress. If this happens, your health care provider may talk with you about: ? Rupturing your amniotic sac if it has not ruptured yet. ? Giving you medicine to help make your contractions stronger and more frequent.  Stage 1 ends when your cervix is completely dilated to 4 inches (10 cm) and completely effaced. This happens at the end of the transition phase. Stage 2  Once   your cervix is completely effaced and dilated to 4 inches (10 cm), you may start to feel an urge to push. It is common for the body to naturally take a rest before feeling the urge to push, especially if you received an epidural or certain other pain medicines. This rest period may last for up to 1-2 hours, depending on your unique labor experience.  During stage 2, contractions are generally less painful, because pushing helps relieve contraction pain. Instead of contraction pain, you may feel stretching and burning pain, especially when the widest part of your baby's head passes through the vaginal opening (crowning).  Your health care provider will closely monitor your pushing progress and your baby's progress through the vagina during stage 2.  Your  health care provider may massage the area of skin between your vaginal opening and anus (perineum) or apply warm compresses to your perineum. This helps it stretch as the baby's head starts to crown, which can help prevent perineal tearing. ? In some cases, an incision may be made in your perineum (episiotomy) to allow the baby to pass through the vaginal opening. An episiotomy helps to make the opening of the vagina larger to allow more room for the baby to fit through.  It is very important to breathe and focus so your health care provider can control the delivery of your baby's head. Your health care provider may have you decrease the intensity of your pushing, to help prevent perineal tearing.  After delivery of your baby's head, the shoulders and the rest of the body generally deliver very quickly and without difficulty.  Once your baby is delivered, the umbilical cord may be cut right away, or this may be delayed for 1-2 minutes, depending on your baby's health. This may vary among health care providers, hospitals, and birth centers.  If you and your baby are healthy enough, your baby may be placed on your chest or abdomen to help maintain the baby's temperature and to help you bond with each other. Some mothers and babies start breastfeeding at this time. Your health care team will dry your baby and help keep your baby warm during this time.  Your baby may need immediate care if he or she: ? Showed signs of distress during labor. ? Has a medical condition. ? Was born too early (prematurely). ? Had a bowel movement before birth (meconium). ? Shows signs of difficulty transitioning from being inside the uterus to being outside of the uterus. If you are planning to breastfeed, your health care team will help you begin a feeding. Stage 3  The third stage of labor starts immediately after the birth of your baby and ends after you deliver the placenta. The placenta is an organ that develops  during pregnancy to provide oxygen and nutrients to your baby in the womb.  Delivering the placenta may require some pushing, and you may have mild contractions. Breastfeeding can stimulate contractions to help you deliver the placenta.  After the placenta is delivered, your uterus should tighten (contract) and become firm. This helps to stop bleeding in your uterus. To help your uterus contract and to control bleeding, your health care provider may: ? Give you medicine by injection, through an IV tube, by mouth, or through your rectum (rectally). ? Massage your abdomen or perform a vaginal exam to remove any blood clots that are left in your uterus. ? Empty your bladder by placing a thin, flexible tube (catheter) into your bladder. ? Encourage   you to breastfeed your baby. After labor is over, you and your baby will be monitored closely to ensure that you are both healthy until you are ready to go home. Your health care team will teach you how to care for yourself and your baby. This information is not intended to replace advice given to you by your health care provider. Make sure you discuss any questions you have with your health care provider. Document Released: 06/05/2008 Document Revised: 03/16/2016 Document Reviewed: 09/11/2015 Elsevier Interactive Patient Education  2018 [employer]  

## 2017-03-15 NOTE — Progress Notes (Signed)
No vb. No lof.  

## 2017-03-15 NOTE — Progress Notes (Signed)
Had some intermittent cramping this morning. Declined cervical check today. Discussed possible sweep of cervix at next visit. Good fetal movement. No LOF, VB.

## 2017-03-22 ENCOUNTER — Ambulatory Visit (INDEPENDENT_AMBULATORY_CARE_PROVIDER_SITE_OTHER): Payer: 59 | Admitting: Obstetrics and Gynecology

## 2017-03-22 VITALS — BP 116/74 | Wt 184.0 lb

## 2017-03-22 DIAGNOSIS — O0993 Supervision of high risk pregnancy, unspecified, third trimester: Secondary | ICD-10-CM

## 2017-03-22 DIAGNOSIS — Z3A39 39 weeks gestation of pregnancy: Secondary | ICD-10-CM

## 2017-03-22 DIAGNOSIS — O34219 Maternal care for unspecified type scar from previous cesarean delivery: Secondary | ICD-10-CM

## 2017-03-22 NOTE — Progress Notes (Signed)
Prenatal Visit Note Date: 03/22/2017 Clinic: [facility]  Subjective:  Sharon Weaver is a 31 y.o. G3P2002 at 1563w6d being seen today for ongoing prenatal care.  She is currently monitored for the following issues for this high-risk pregnancy and has Bladder diverticulum; History of palpitations; Hypokalemia; Abnormal TSH; Dizziness; Urethra, diverticulum; Paroxysmal supraventricular tachycardia (HCC); B12 nutritional deficiency; Supervision of high risk pregnancy, antepartum, third trimester; History of cesarean delivery affecting pregnancy; Periurethral cyst; and Frequent PVCs on her problem list.  Patient reports no bleeding, no contractions and no leaking.   Contractions: Not present. Vag. Bleeding: None.  Movement: Present. Denies leaking of fluid.   The following portions of the patient's history were reviewed and updated as appropriate: allergies, current medications, past family history, past medical history, past social history, past surgical history and problem list. Problem list updated.  Objective:   Vitals:   03/22/17 1123  BP: 116/74  Weight: 184 lb (83.5 kg)    Fetal Status: Fetal Heart Rate (bpm): 135 Fundal Height: 39 cm Movement: Present  Presentation: Vertex  General:  Alert, oriented and cooperative. Patient is in no acute distress.  Skin: Skin is warm and dry. No rash noted.   Cardiovascular: Normal heart rate noted  Respiratory: Normal respiratory effort, no problems with respiration noted  Abdomen: Soft, gravid, appropriate for gestational age. Pain/Pressure: Present     Pelvic:  Cervical exam performed Dilation: 1 Effacement (%): 50 Station: -3 Female chaperone present  Extremities: Normal range of motion.     Mental Status: Normal mood and affect. Normal behavior. Normal judgment and thought content.   Urinalysis: Urine Protein: Negative Urine Glucose: Negative  Assessment and Plan:  Pregnancy: G3P2002 at 3063w6d  1. Supervision of high risk pregnancy,  antepartum, third trimester   2. History of cesarean delivery affecting pregnancy Schedule for repeat on 7/19 if does not spontaneously labor  3. [redacted] weeks gestation of pregnancy F/u 1 week  Term labor symptoms and general obstetric precautions including but not limited to vaginal bleeding, contractions, leaking of fluid and fetal movement were reviewed in detail with the patient. Please refer to After Visit Summary for other counseling recommendations.  Return in about 1 week (around 03/29/2017) for Routine Prenatal Appointment, H&P.  Thomasene MohairStephen Jackson, MD 03/22/2017 12:07 PM

## 2017-03-27 ENCOUNTER — Ambulatory Visit (INDEPENDENT_AMBULATORY_CARE_PROVIDER_SITE_OTHER): Payer: 59 | Admitting: Obstetrics & Gynecology

## 2017-03-27 ENCOUNTER — Encounter
Admission: RE | Admit: 2017-03-27 | Discharge: 2017-03-27 | Disposition: A | Discharge status: Home / Self Care | Payer: Commercial Managed Care - HMO | Source: Ambulatory Visit | Attending: Obstetrics & Gynecology | Admitting: Obstetrics & Gynecology

## 2017-03-27 ENCOUNTER — Encounter: Payer: Self-pay | Admitting: Obstetrics & Gynecology

## 2017-03-27 VITALS — BP 110/70 | HR 76 | Ht 64.0 in | Wt 182.0 lb

## 2017-03-27 DIAGNOSIS — Z3A40 40 weeks gestation of pregnancy: Secondary | ICD-10-CM

## 2017-03-27 DIAGNOSIS — O0993 Supervision of high risk pregnancy, unspecified, third trimester: Secondary | ICD-10-CM

## 2017-03-27 DIAGNOSIS — O34219 Maternal care for unspecified type scar from previous cesarean delivery: Secondary | ICD-10-CM

## 2017-03-27 HISTORY — DX: Headache: R51

## 2017-03-27 HISTORY — DX: Headache, unspecified: R51.9

## 2017-03-27 LAB — CBC
HEMATOCRIT: 34 % — AB (ref 35.0–47.0)
HEMOGLOBIN: 11.6 g/dL — AB (ref 12.0–16.0)
MCH: 28.8 pg (ref 26.0–34.0)
MCHC: 34.1 g/dL (ref 32.0–36.0)
MCV: 84.4 fL (ref 80.0–100.0)
Platelets: 140 10*3/uL — ABNORMAL LOW (ref 150–440)
RBC: 4.03 MIL/uL (ref 3.80–5.20)
RDW: 16.2 % — ABNORMAL HIGH (ref 11.5–14.5)
WBC: 8.2 10*3/uL (ref 3.6–11.0)

## 2017-03-27 LAB — DIFFERENTIAL
BASOS ABS: 0 10*3/uL (ref 0–0.1)
Basophils Relative: 0 %
Eosinophils Absolute: 0.1 10*3/uL (ref 0–0.7)
Eosinophils Relative: 1 %
LYMPHS ABS: 1.7 10*3/uL (ref 1.0–3.6)
Lymphocytes Relative: 21 %
MONO ABS: 0.6 10*3/uL (ref 0.2–0.9)
MONOS PCT: 7 %
NEUTROS ABS: 5.9 10*3/uL (ref 1.4–6.5)
Neutrophils Relative %: 71 %

## 2017-03-27 NOTE — Patient Instructions (Signed)
Cesarean Delivery °Cesarean birth, or cesarean delivery, is the surgical delivery of a baby through an incision in the abdomen and the uterus. This may be referred to as a C-section. This procedure may be scheduled ahead of time, or it may be done in an emergency situation. °Tell a health care provider about: °· Any allergies you have. °· All medicines you are taking, including vitamins, herbs, eye drops, creams, and over-the-counter medicines. °· Any problems you or family members have had with anesthetic medicines. °· Any blood disorders you have. °· Any surgeries you have had. °· Any medical conditions you have. °· Whether you or any members of your family have a history of deep vein thrombosis (DVT) or pulmonary embolism (PE). °What are the risks? °Generally, this is a safe procedure. However, problems may occur, including: °· Infection. °· Bleeding. °· Allergic reactions to medicines. °· Damage to other structures or organs. °· Blood clots. °· Injury to your baby. ° °What happens before the procedure? °· Follow instructions from your health care provider about eating or drinking restrictions. °· Follow instructions from your health care provider about bathing before your procedure to help reduce your risk of infection. °· If you know that you are going to have a cesarean delivery, do not shave your pubic area. Shaving before the procedure may increase your risk of infection. °· Ask your health care provider about: °? Changing or stopping your regular medicines. This is especially important if you are taking diabetes medicines or blood thinners. °? Your pain management plan. This is especially important if you plan to breastfeed your baby. °? How long you will be in the hospital after the procedure. °? Any concerns you may have about receiving blood products if you need them during the procedure. °? Cord blood banking, if you plan to collect your baby’s umbilical cord blood. °· You may also want to ask your  health care provider: °? Whether you will be able to hold or breastfeed your baby while you are still in the operating room. °? Whether your baby can stay with you immediately after the procedure and during your recovery. °? Whether a family member or a person of your choice can go with you into the operating room and stay with you during the procedure, immediately after the procedure, and during your recovery. °· Plan to have someone drive you home when you are discharged from the hospital. °What happens during the procedure? °· Fetal monitors will be placed on your abdomen to monitor your heart rate and your baby's heart rate. °· Depending on the reason for your cesarean delivery, you may have a physical exam or additional testing, such as an ultrasound. °· An IV tube will be inserted into one of your veins. °· You may have your blood or urine tested. °· You will be given antibiotic medicine to help prevent infection. °· You may be given a special warming gown to wear to keep your temperature stable. °· Hair may be removed from your pubic area. °· The skin of your pubic area and lower abdomen will be cleaned with a germ-killing solution (antiseptic). °· A catheter may be inserted into your bladder through your urethra. This drains your urine during the procedure. °· You may be given one or more of the following: °? A medicine to numb the area (local anesthetic). °? A medicine to make you fall asleep (general anesthetic). °? A medicine (regional anesthetic) that is injected into your back or through a small   thin tube placed in your back (spinal anesthetic or epidural anesthetic). This numbs everything below the injection site and allows you to stay awake during your procedure. If this makes you feel nauseous, tell your health care provider. Medicines will be available to help reduce any nausea you may feel. °· An incision will be made in your abdomen, and then in your uterus. °· If you are awake during your  procedure, you may feel tugging and pulling in your abdomen, but you should not feel pain. If you feel pain, tell your health care provider immediately. °· Your baby will be removed from your uterus. You may feel more pressure or pushing while this happens. °· Immediately after birth, your baby will be dried and kept warm. You may be able to hold and breastfeed your baby. The umbilical cord may be clamped and cut during this time. °· Your placenta will be removed from your uterus. °· Your incisions will be closed with stitches (sutures). Staples, skin glue, or adhesive strips may also be applied to the incision in your abdomen. °· Bandages (dressings) will be placed over the incision in your abdomen. °The procedure may vary among health care providers and hospitals. °What happens after the procedure? °· Your blood pressure, heart rate, breathing rate, and blood oxygen level will be monitored often until the medicines you were given have worn off. °· You may continue to receive fluids and medicines through an IV tube. °· You will have some pain. Medicines will be available to help control your pain. °· To help prevent blood clots: °? You may be given medicines. °? You may have to wear compression stockings or devices. °? You will be encouraged to walk around when you are able. °· Hospital staff will encourage and support bonding with your baby. Your hospital may allow you and your baby to stay in the same room (rooming in) during your hospital stay to encourage successful breastfeeding. °· You may be encouraged to cough and breathe deeply often. This helps to prevent lung problems. °· If you have a catheter draining your urine, it will be removed as soon as possible after your procedure. °This information is not intended to replace advice given to you by your health care provider. Make sure you discuss any questions you have with your health care provider. °Document Released: 08/27/2005 Document Revised: 02/02/2016  Document Reviewed: 06/07/2015 °Elsevier Interactive Patient Education © 2017 [employer] ° °

## 2017-03-27 NOTE — Progress Notes (Signed)
PRE-OPERATIVE HISTORY AND PHYSICAL EXAM  HPI:  Sharon Weaver is a 31 y.o. [id number].  Patient's last menstrual period was 06/16/2016.  512w4d Estimated Date of Delivery: 03/23/17  She is being admitted for Prior uterine surgery (2 cesareans) and Desire for Permanent Sterility.  See prenatals for history of pregnancy.    PMHx: She  has a past medical history of Dysfunctional uterine bleeding. 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 She has never used smokeless tobacco. She reports that she drinks about 4.8 oz of alcohol per week . She reports that she does not use drugs.  Meds- PNV Also, has No Known Allergies.  Review of Systems  Constitutional: Negative for chills, fever and malaise/fatigue.  HENT: Negative for congestion, sinus pain and sore throat.   Eyes: Negative for blurred vision and pain.  Respiratory: Negative for cough and wheezing.   Cardiovascular: Negative for chest pain and leg swelling.  Gastrointestinal: Negative for abdominal pain, constipation, diarrhea, heartburn, nausea and vomiting.  Genitourinary: Negative for dysuria, frequency, hematuria and urgency.  Musculoskeletal: Negative for back pain, joint pain, myalgias and neck pain.  Skin: Negative for itching and rash.  Neurological: Negative for dizziness, tremors and weakness.  Endo/Heme/Allergies: Does not bruise/bleed easily.  Psychiatric/Behavioral: Negative for depression. The patient is not nervous/anxious and does not have insomnia.    Objective: BP 110/70   Pulse 76   Ht 5\' 4"  (1.626 m)   Wt 182 lb (82.6 kg)   LMP 06/16/2016   BMI 31.24 kg/m  Filed Weights   03/27/17 0844  Weight: 182 lb (82.6  kg)   Physical Exam  Constitutional: She appears well-developed and well-nourished.  Vitals reviewed.  HEENT Normocephalic, atruamatic OP clear  Skin No rashes, lesions or ulceration. Normal palpated skin turgor. No nodularity.  Lungs: Clear to auscultation.No rales or wheezes. Normal Respiratory effort, no retractions.  Heart: NSR.  No murmurs or rubs appreciated. No periferal edema  Abdomen: Gravid.  Non-tender.  No masses.  No HSM. No hernia  Extremities: Moves all appropriately.  Normal ROM for age. No lymphadenopathy.  Neuro: Grossly intact  Psych: Oriented to PPT.  Normal mood. Normal affect.     Pelvic:   Vulva: Normal appearance.  No lesions.  Vagina: No lesions or abnormalities noted.  Urethra No masses tenderness or scarring.  Meatus Normal size without lesions or prolapse.  Cervix: 1/50.   Perineum: Normal exam.  No lesions.        Bimanual   Uterus: Enlarged.  Non-tender.    Adnexae: Not palpated.  Cul-de-sac: Negative for abnormality.    OB History  Gravida Para Term Preterm AB Living  3 2 2     2   SAB TAB Ectopic Multiple Live Births          2    # Outcome Date GA Lbr Len/2nd Weight Sex Delivery Anes PTL Lv  3 Current           2 Term     M CS-Classical   LIV  1 Term     F CS-Classical   LIV    Patient denies any other pertinent gynecologic issues. See prenatal record for more complete H&P  Assessment: 1. History of  cesarean delivery affecting pregnancy   2. Supervision of high risk pregnancy, antepartum, third trimester   3. [redacted] weeks gestation of pregnancy    PLAN: 1.  Cesarean Delivery as Scheduled. 2. BTL  Patient will undergo surgical management with Cesarean Section.   The risks of surgery were discussed in detail with the patient including but not limited to: bleeding which may require transfusion or reoperation; infection which may require antibiotics; injury to surrounding organs which may involve bowel, bladder, ureters ; need for additional  procedures including laparoscopy or laparotomy; thromboembolic phenomenon, surgical site problems and other postoperative/anesthesia complications. Likelihood of success in alleviating the patient's condition was discussed. Routine postoperative instructions will be reviewed with the patient and her family in detail after surgery.  The patient concurred with the proposed plan, giving informed written consent for the surgery.  Patient will be NPO procedure.  Preoperative prophylactic antibiotics, as necessary, and SCDs ordered on call to the OR.  The patient has been fully informed about all methods of contraception, both temporary and permanent. She understands that tubal ligation is meant to be permanent, absolute and irreversible. She was told that there is an approximately 1 in 400 chance of a pregnancy in the future after tubal ligation. She was told the short and long term complications of tubal ligation. She understands the risks from this surgery include, but are not limited to, the risks of anesthesia, hemorrhage, infection, perforation, and injury to adjacent structures, bowel, bladder and blood vessels.   Annamarie Major, M.D. 03/27/2017 9:10 AM

## 2017-03-27 NOTE — Patient Instructions (Signed)
  Your procedure is scheduled on:March 28, 2017 (THURSDAY) Report to EMERGENCY DEPARTMENT ARRIVAL TIME 5:30 AM  Remember: Instructions that are not followed completely may result in serious medical risk, up to and including death, or upon the discretion of your surgeon and anesthesiologist your surgery may need to be rescheduled.    _x___ 1. Do not eat food or drink liquids after midnight. No gum chewing or  hard candies                              __x__ 2. No Alcohol for 24 hours before or after surgery.   __x__3. No Smoking for 24 prior to surgery.   ____  4. Bring all medications with you on the day of surgery if instructed.    __x__ 5. Notify your doctor if there is any change in your medical condition     (cold, fever, infections).     Do not wear jewelry, make-up, hairpins, clips or nail polish.  Do not wear lotions, powders, or perfumes.   Do not shave 48 hours prior to surgery. Men may shave face and neck.  Do not bring valuables to the hospital.    [facility] any belongings or valuables.               Contacts, dentures or bridgework may not be worn into surgery.  Leave your suitcase in the car. After surgery it may be brought to your room.  For patients admitted to the hospital, discharge time is determined by your                         Patients discharged the day of surgery will not be allowed to drive home.  You will need someone to drive you home and stay with you the night of your procedure.    Please read over the following fact sheets that you were given:   [facility] Pre[facility] and or MRSA Information   ___ Take anti-hypertensive (unless it includes a diuretic), cardiac, seizure, asthma,     anti-reflux and psychiatric medicines. These include:  1.   2.  3.  4.  5.  6.  ____Fleets enema or Magnesium Citrate as directed.   _x___ Use CHG Soap or sage wipes as directed on instruction sheet   ____ Use inhalers on the day  of surgery and bring to hospital day of surgery  ____ Stop Metformin and Janumet 2 days prior to surgery.    ____ Take 1/2 of usual insulin dose the night before surgery and none on the morning surgery    _x___ Follow recommendations from Cardiologist, Pulmonologist or PCP regarding          stopping Aspirin, Coumadin, Plavix ,Eliquis, Effient, or Pradaxa, and Pletal.  X____Stop Anti-inflammatories such as Advil, Aleve, Ibuprofen, Motrin, Naproxen, Naprosyn, Goodies powders or aspirin products. OK to take Tylenol   _x___ Stop supplements until after surgery.  But may continue Vitamin D, Vitamin B,  and multivitamin      ____ Bring C-Pap to the hospital.

## 2017-03-28 ENCOUNTER — Encounter
Admission: EM | Disposition: A | Discharge status: Home / Self Care | Payer: Self-pay | Source: Home / Self Care | Attending: Obstetrics & Gynecology

## 2017-03-28 ENCOUNTER — Inpatient Hospital Stay
Admission: EM | Admit: 2017-03-28 | Discharge: 2017-03-30 | DRG: 766 | Disposition: A | Discharge status: Home / Self Care | Payer: Commercial Managed Care - HMO | Attending: Obstetrics & Gynecology | Admitting: Obstetrics & Gynecology

## 2017-03-28 ENCOUNTER — Inpatient Hospital Stay: Payer: Commercial Managed Care - HMO | Admitting: Anesthesiology

## 2017-03-28 ENCOUNTER — Inpatient Hospital Stay: Admission: RE | Admit: 2017-03-28 | Payer: 59 | Source: Ambulatory Visit | Admitting: Obstetrics & Gynecology

## 2017-03-28 DIAGNOSIS — Z3A40 40 weeks gestation of pregnancy: Secondary | ICD-10-CM | POA: Diagnosis not present

## 2017-03-28 DIAGNOSIS — Z302 Encounter for sterilization: Secondary | ICD-10-CM

## 2017-03-28 DIAGNOSIS — O480 Post-term pregnancy: Secondary | ICD-10-CM

## 2017-03-28 DIAGNOSIS — O34211 Maternal care for low transverse scar from previous cesarean delivery: Secondary | ICD-10-CM | POA: Diagnosis present

## 2017-03-28 DIAGNOSIS — O34219 Maternal care for unspecified type scar from previous cesarean delivery: Secondary | ICD-10-CM | POA: Diagnosis present

## 2017-03-28 DIAGNOSIS — O0993 Supervision of high risk pregnancy, unspecified, third trimester: Secondary | ICD-10-CM

## 2017-03-28 DIAGNOSIS — Z87891 Personal history of nicotine dependence: Secondary | ICD-10-CM | POA: Diagnosis not present

## 2017-03-28 LAB — RPR: RPR: NONREACTIVE

## 2017-03-28 LAB — CBC
HCT: 34.2 % — ABNORMAL LOW (ref 35.0–47.0)
Hemoglobin: 11.7 g/dL — ABNORMAL LOW (ref 12.0–16.0)
MCH: 28.9 pg (ref 26.0–34.0)
MCHC: 34.1 g/dL (ref 32.0–36.0)
MCV: 84.8 fL (ref 80.0–100.0)
PLATELETS: 150 10*3/uL (ref 150–440)
RBC: 4.04 MIL/uL (ref 3.80–5.20)
RDW: 16.4 % — ABNORMAL HIGH (ref 11.5–14.5)
WBC: 8.1 10*3/uL (ref 3.6–11.0)

## 2017-03-28 LAB — ABO/RH: ABO/RH(D): AB POS

## 2017-03-28 SURGERY — Surgical Case
Anesthesia: Spinal | Laterality: Bilateral | Wound class: Clean Contaminated

## 2017-03-28 MED ORDER — PHENYLEPHRINE HCL 10 MG/ML IJ SOLN
INTRAMUSCULAR | Status: DC | PRN
  Administered 2017-03-28 (×3): 100 ug via INTRAVENOUS

## 2017-03-28 MED ORDER — MORPHINE SULFATE (PF) 10 MG/ML IV SOLN
INTRAVENOUS | Status: DC | PRN
  Administered 2017-03-28: .1 mg via INTRAMUSCULAR

## 2017-03-28 MED ORDER — OXYTOCIN 40 UNITS IN LACTATED RINGERS INFUSION - SIMPLE MED
2.5000 [IU]/h | INTRAVENOUS | Status: AC
  Administered 2017-03-28: 2.5 [IU]/h via INTRAVENOUS

## 2017-03-28 MED ORDER — ZOLPIDEM TARTRATE 5 MG PO TABS: 5.0000 mg | ORAL_TABLET | Freq: Every evening | ORAL | Status: DC | PRN

## 2017-03-28 MED ORDER — PRENATAL MULTIVITAMIN CH
1.0000 | ORAL_TABLET | Freq: Every day | ORAL | Status: DC
Start: 2017-03-28 — End: 2017-03-30
  Administered 2017-03-29 – 2017-03-30 (×2): 1 via ORAL
  Filled 2017-03-28: qty 1

## 2017-03-28 MED ORDER — SIMETHICONE 80 MG PO CHEW
80.0000 mg | CHEWABLE_TABLET | Freq: Three times a day (TID) | ORAL | Status: DC
Start: 2017-03-28 — End: 2017-03-30
  Administered 2017-03-28 – 2017-03-30 (×7): 80 mg via ORAL
  Filled 2017-03-28 (×7): qty 1

## 2017-03-28 MED ORDER — EPHEDRINE SULFATE 50 MG/ML IJ SOLN
INTRAMUSCULAR | Status: AC
  Filled 2017-03-28: qty 1

## 2017-03-28 MED ORDER — PROPOFOL 10 MG/ML IV BOLUS
INTRAVENOUS | Status: AC
  Filled 2017-03-28: qty 20

## 2017-03-28 MED ORDER — COCONUT OIL OIL
1.0000 "application " | TOPICAL_OIL | Status: DC | PRN
  Administered 2017-03-28: 1 via TOPICAL
  Filled 2017-03-28: qty 120

## 2017-03-28 MED ORDER — NALBUPHINE HCL 10 MG/ML IJ SOLN
5.0000 mg | INTRAMUSCULAR | Status: DC | PRN
  Filled 2017-03-28: qty 0.5

## 2017-03-28 MED ORDER — MENTHOL 3 MG MT LOZG
1.0000 | LOZENGE | OROMUCOSAL | Status: DC | PRN
  Filled 2017-03-28: qty 9

## 2017-03-28 MED ORDER — LACTATED RINGERS IV SOLN: INTRAVENOUS | Status: DC

## 2017-03-28 MED ORDER — DIPHENHYDRAMINE HCL 25 MG PO CAPS: 25.0000 mg | ORAL_CAPSULE | Freq: Four times a day (QID) | ORAL | Status: DC | PRN

## 2017-03-28 MED ORDER — BUPIVACAINE HCL (PF) 0.5 % IJ SOLN
5.0000 mL | Freq: Once | INTRAMUSCULAR | Status: DC
  Filled 2017-03-28: qty 30

## 2017-03-28 MED ORDER — PHENYLEPHRINE HCL 10 MG/ML IJ SOLN
INTRAMUSCULAR | Status: AC
  Filled 2017-03-28: qty 1

## 2017-03-28 MED ORDER — BUPIVACAINE IN DEXTROSE 0.75-8.25 % IT SOLN
INTRATHECAL | Status: AC
Start: 2017-03-28 — End: 2017-03-28
  Filled 2017-03-28: qty 2

## 2017-03-28 MED ORDER — OXYCODONE-ACETAMINOPHEN 5-325 MG PO TABS
1.0000 | ORAL_TABLET | ORAL | Status: DC | PRN
Start: 2017-03-28 — End: 2017-03-29

## 2017-03-28 MED ORDER — NALOXONE HCL 0.4 MG/ML IJ SOLN
0.4000 mg | INTRAMUSCULAR | Status: DC | PRN
  Filled 2017-03-28: qty 1

## 2017-03-28 MED ORDER — BUPIVACAINE HCL (PF) 0.5 % IJ SOLN: 5.0000 mL | Freq: Once | INTRAMUSCULAR | Status: DC

## 2017-03-28 MED ORDER — SIMETHICONE 80 MG PO CHEW
80.0000 mg | CHEWABLE_TABLET | ORAL | Status: DC
  Administered 2017-03-29 (×2): 80 mg via ORAL
  Filled 2017-03-28 (×2): qty 1

## 2017-03-28 MED ORDER — ACETAMINOPHEN 325 MG PO TABS
650.0000 mg | ORAL_TABLET | ORAL | Status: DC | PRN
  Administered 2017-03-29 – 2017-03-30 (×4): 650 mg via ORAL
  Filled 2017-03-28 (×4): qty 2

## 2017-03-28 MED ORDER — BUPIVACAINE HCL (PF) 0.5 % IJ SOLN
INTRAMUSCULAR | Status: DC | PRN
  Administered 2017-03-28: 30 mL

## 2017-03-28 MED ORDER — OXYTOCIN 40 UNITS IN LACTATED RINGERS INFUSION - SIMPLE MED
INTRAVENOUS | Status: DC | PRN
  Administered 2017-03-28: 350 mL via INTRAVENOUS

## 2017-03-28 MED ORDER — MEPERIDINE HCL 50 MG/ML IJ SOLN: 6.2500 mg | INTRAMUSCULAR | Status: DC | PRN

## 2017-03-28 MED ORDER — OXYTOCIN 40 UNITS IN LACTATED RINGERS INFUSION - SIMPLE MED
INTRAVENOUS | Status: AC
  Filled 2017-03-28: qty 1000

## 2017-03-28 MED ORDER — WITCH HAZEL-GLYCERIN EX PADS: 1.0000 "application " | MEDICATED_PAD | CUTANEOUS | Status: DC | PRN

## 2017-03-28 MED ORDER — NALBUPHINE HCL 10 MG/ML IJ SOLN
5.0000 mg | Freq: Once | INTRAMUSCULAR | Status: DC | PRN
  Filled 2017-03-28: qty 0.5

## 2017-03-28 MED ORDER — ONDANSETRON HCL 4 MG/2ML IJ SOLN
INTRAMUSCULAR | Status: AC
  Filled 2017-03-28: qty 2

## 2017-03-28 MED ORDER — LACTATED RINGERS IV SOLN
INTRAVENOUS | Status: DC
  Administered 2017-03-28: 1000 mL via INTRAVENOUS

## 2017-03-28 MED ORDER — BUPIVACAINE 0.25 % ON-Q PUMP DUAL CATH 400 ML
400.0000 mL | INJECTION | Status: DC
  Filled 2017-03-28: qty 400

## 2017-03-28 MED ORDER — TETANUS-DIPHTH-ACELL PERTUSSIS 5-2.5-18.5 LF-MCG/0.5 IM SUSP
0.5000 mL | Freq: Once | INTRAMUSCULAR | Status: DC
  Filled 2017-03-28: qty 0.5

## 2017-03-28 MED ORDER — OXYCODONE-ACETAMINOPHEN 5-325 MG PO TABS: 2.0000 | ORAL_TABLET | ORAL | Status: DC | PRN

## 2017-03-28 MED ORDER — FENTANYL CITRATE (PF) 100 MCG/2ML IJ SOLN: 25.0000 ug | INTRAMUSCULAR | Status: DC | PRN

## 2017-03-28 MED ORDER — LACTATED RINGERS IV SOLN
Freq: Once | INTRAVENOUS | Status: AC
  Administered 2017-03-28: 06:00:00 via INTRAVENOUS

## 2017-03-28 MED ORDER — FENTANYL CITRATE (PF) 100 MCG/2ML IJ SOLN
INTRAMUSCULAR | Status: DC | PRN
  Administered 2017-03-28: 15 ug via INTRATHECAL

## 2017-03-28 MED ORDER — CEFOXITIN SODIUM-DEXTROSE 2-2.2 GM-% IV SOLR (PREMIX)
2.0000 g | INTRAVENOUS | Status: AC
  Administered 2017-03-28: 2 g via INTRAVENOUS
  Filled 2017-03-28: qty 50

## 2017-03-28 MED ORDER — ONDANSETRON HCL 4 MG/2ML IJ SOLN: 4.0000 mg | Freq: Three times a day (TID) | INTRAMUSCULAR | Status: DC | PRN

## 2017-03-28 MED ORDER — NALBUPHINE HCL 10 MG/ML IJ SOLN
5.0000 mg | INTRAMUSCULAR | Status: DC | PRN
Start: 2017-03-28 — End: 2017-03-28
  Filled 2017-03-28: qty 0.5

## 2017-03-28 MED ORDER — MORPHINE SULFATE (PF) 0.5 MG/ML IJ SOLN
INTRAMUSCULAR | Status: AC
  Filled 2017-03-28: qty 10

## 2017-03-28 MED ORDER — SIMETHICONE 80 MG PO CHEW: 80.0000 mg | CHEWABLE_TABLET | ORAL | Status: DC | PRN

## 2017-03-28 MED ORDER — LACTATED RINGERS IV SOLN
INTRAVENOUS | Status: DC
  Administered 2017-03-28 – 2017-03-29 (×2): via INTRAVENOUS

## 2017-03-28 MED ORDER — MORPHINE SULFATE (PF) 2 MG/ML IV SOLN: 1.0000 mg | INTRAVENOUS | Status: DC | PRN

## 2017-03-28 MED ORDER — KETOROLAC TROMETHAMINE 30 MG/ML IJ SOLN
30.0000 mg | Freq: Four times a day (QID) | INTRAMUSCULAR | Status: AC
  Administered 2017-03-28 – 2017-03-29 (×4): 30 mg via INTRAVENOUS
  Filled 2017-03-28 (×4): qty 1

## 2017-03-28 MED ORDER — NALOXONE HCL 2 MG/2ML IJ SOSY
1.0000 ug/kg/h | PREFILLED_SYRINGE | INTRAVENOUS | Status: DC | PRN
  Filled 2017-03-28: qty 2

## 2017-03-28 MED ORDER — FENTANYL CITRATE (PF) 100 MCG/2ML IJ SOLN
INTRAMUSCULAR | Status: AC
  Filled 2017-03-28: qty 2

## 2017-03-28 MED ORDER — BUPIVACAINE IN DEXTROSE 0.75-8.25 % IT SOLN
INTRATHECAL | Status: DC | PRN
  Administered 2017-03-28: 1.6 mL via INTRATHECAL

## 2017-03-28 MED ORDER — SOD CITRATE-CITRIC ACID 500-334 MG/5ML PO SOLN
30.0000 mL | ORAL | Status: AC
  Administered 2017-03-28: 30 mL via ORAL
  Filled 2017-03-28: qty 15

## 2017-03-28 MED ORDER — ONDANSETRON HCL 4 MG/2ML IJ SOLN
4.0000 mg | Freq: Once | INTRAMUSCULAR | Status: AC | PRN
  Administered 2017-03-28: 4 mg via INTRAVENOUS

## 2017-03-28 MED ORDER — SODIUM CHLORIDE 0.9% FLUSH: 3.0000 mL | INTRAVENOUS | Status: DC | PRN

## 2017-03-28 MED ORDER — DIBUCAINE 1 % RE OINT: 1.0000 "application " | TOPICAL_OINTMENT | RECTAL | Status: DC | PRN

## 2017-03-28 MED ORDER — SENNOSIDES-DOCUSATE SODIUM 8.6-50 MG PO TABS
2.0000 | ORAL_TABLET | ORAL | Status: DC
  Administered 2017-03-29: 2 via ORAL
  Filled 2017-03-28 (×2): qty 2

## 2017-03-28 SURGICAL SUPPLY — 23 items
CANISTER SUCT 3000ML PPV (MISCELLANEOUS) ×3 IMPLANT
CATH KIT ON-Q SILVERSOAK 5IN (CATHETERS) ×6 IMPLANT
CHLORAPREP W/TINT 26ML (MISCELLANEOUS) ×6 IMPLANT
DERMABOND ADVANCED (GAUZE/BANDAGES/DRESSINGS) ×2
DERMABOND ADVANCED .7 DNX12 (GAUZE/BANDAGES/DRESSINGS) ×1 IMPLANT
DRSG OPSITE POSTOP 4X10 (GAUZE/BANDAGES/DRESSINGS) ×3 IMPLANT
ELECT CAUTERY BLADE 6.4 (BLADE) ×3 IMPLANT
ELECT REM PT RETURN 9FT ADLT (ELECTROSURGICAL) ×3
ELECTRODE REM PT RTRN 9FT ADLT (ELECTROSURGICAL) ×1 IMPLANT
GLOVE SKINSENSE NS SZ8.0 LF (GLOVE) ×2
GLOVE SKINSENSE STRL SZ8.0 LF (GLOVE) ×1 IMPLANT
GOWN STRL REUS W/ TWL LRG LVL3 (GOWN DISPOSABLE) ×1 IMPLANT
GOWN STRL REUS W/ TWL XL LVL3 (GOWN DISPOSABLE) ×2 IMPLANT
GOWN STRL REUS W/TWL LRG LVL3 (GOWN DISPOSABLE) ×2
GOWN STRL REUS W/TWL XL LVL3 (GOWN DISPOSABLE) ×4
NS IRRIG 1000ML POUR BTL (IV SOLUTION) ×3 IMPLANT
PACK C SECTION AR (MISCELLANEOUS) ×3 IMPLANT
PAD OB MATERNITY 4.3X12.25 (PERSONAL CARE ITEMS) ×3 IMPLANT
PAD PREP 24X41 OB/GYN DISP (PERSONAL CARE ITEMS) ×3 IMPLANT
SUT MAXON ABS #0 GS21 30IN (SUTURE) ×6 IMPLANT
SUT VIC AB 1 CT1 36 (SUTURE) ×9 IMPLANT
SUT VIC AB 2-0 CT1 36 (SUTURE) ×3 IMPLANT
SUT VIC AB 4-0 FS2 27 (SUTURE) ×3 IMPLANT

## 2017-03-28 NOTE — Anesthesia Procedure Notes (Signed)
Date/Time: 03/28/2017 7:45 AM Performed by: Henrietta HooverPOPE, Arieana Somoza Pre-anesthesia Checklist: Patient identified, Emergency Drugs available, Suction available, Patient being monitored and Timeout performed Patient Re-evaluated:Patient Re-evaluated prior to induction Oxygen Delivery Method: Nasal cannula Placement Confirmation: positive ETCO2

## 2017-03-28 NOTE — Op Note (Signed)
Cesarean Section Procedure Note Indications: prior cesarean section and term intrauterine pregnancy, Desire for permanent sterility  Pre-operative Diagnosis: Intrauterine pregnancy [redacted]w[redacted]d ;  prior cesarean secti79on and term intrauterine pregnancy, Desire for permanent sterility Post-operative Diagnosis: same, delivered. Procedure: Low Transverse Cesarean Section, Bilateral Tubal Ligation Surgeon: Annamarie MajorPaul Kaitlen Redford, MD Assistant(s): Dr Jean RosenthalJackson Anesthesia: Spinal anesthesia Estimated Blood Loss:200  Complications: None; patient tolerated the procedure well. Disposition: PACU - hemodynamically stable. Condition: stable  Findings: A female infant in the cephalic presentation. Amniotic fluid - Clear  Birth weight 7-9lbs.  Apgars of 9 and 9.  Intact placenta with a three-vessel cord. Grossly normal uterus, tubes and ovaries bilaterally. Min intraabdominal adhesions were noted.  Procedure Details   The patient was taken to Operating Room, identified as the correct patient and the procedure verified as C-Section Delivery. A Time Out was held and the above information confirmed. After induction of anesthesia, the patient was draped and prepped in the usual sterile manner. A Pfannenstiel incision was made and carried down through the subcutaneous tissue to the fascia. Fascial incision was made and extended transversely with the Mayo scissors. The fascia was separated from the underlying rectus tissue superiorly and inferiorly. The peritoneum was identified and entered bluntly. Peritoneal incision was extended longitudinally. The utero-vesical peritoneal reflection was incised transversely and a bladder flap was created digitally.  A low transverse hysterotomy was made. The fetus was delivered atraumatically. The umbilical cord was clamped x2 and cut and the infant was handed to the awaiting pediatricians. The placenta was removed intact and appeared normal with a 3-vessel cord.  The uterus was exteriorized and  cleared of all clot and debris. The hysterotomy was closed with running sutures of 0 Vicryl suture. A second imbricating layer was placed with the same suture. Excellent hemostasis was observed.   The left Fallopian tube was identified, grasped with the Babcock clamps, lifted to the skin incision and followed out distally to the fimbriae. An avascular midsection of the tube approximately 3-4cm from the cornua was grasped with the babcock clamps and brought into a knuckle at the skin incision. The tube was double ligated with 2-0 Vicryl suture and the intervening portion of tube was transected and removed. Excellent hemostasis was noted and the tube was returned to the abdomen. Attention was then turned to the right fallopian tube after confirmation of identification by tracing the tube out to the fimbriae. The same procedure was then performed on the right Fallopian tube. Again, excellent hemostasis was noted at the end of the procedure.  The uterus was returned to the abdomen. The pelvis was irrigated and again, excellent hemostasis was noted.  The On Q Pain pump System was then placed.  Trocars were placed through the abdominal wall into the subfascial space and these were used to thread the silver soaker cathaters into place.The rectus fascia was then reapproximated with running sutures of Maxon, with careful placement not to incorporate the cathaters. Subcutaneous tissues are then irrigated with saline and hemostasis assured.  Skin is then closed with 4-0 vicryl suture in a subcuticular fashion followed by skin adhesive. The cathaters are flushed each with 5 mL of Bupivicaine and stabilized into place with dressing. Instrument, sponge, and needle counts were correct prior to the abdominal closure and at the conclusion of the case.  The patient tolerated the procedure well and was transferred to the recovery room in stable condition.

## 2017-03-28 NOTE — H&P (Signed)
History and Physical Interval Note:  03/28/2017 7:02 AM  Sharon Weaver  has presented today for surgery, with the diagnosis of Post Term Pregnancy and History of Prior Cesarean (x2) and desire for sterility.  The various methods of treatment have been discussed with the patient and family. After consideration of risks, benefits and other options for treatment, the patient has consented to  Cesarean Section as well as Tubal Ligation as a surgical intervention .  The patient's history has been reviewed, patient examined, no change in status, stable for surgery.  Pt has the following beta blocker history-  Not taking Beta Blocker.  I have reviewed the patient's chart and labs.  Questions were answered to the patient's satisfaction.    Letitia Libraobert Paul Ramy Greth

## 2017-03-28 NOTE — Anesthesia Procedure Notes (Signed)
Spinal  Patient location during procedure: OR Start time: 03/28/2017 7:40 AM End time: 03/28/2017 7:51 AM Staffing Anesthesiologist: Martha Clan Resident/CRNA: Rolla Kedzierski Performed: resident/CRNA and anesthesiologist  Preanesthetic Checklist Completed: patient identified, site marked, surgical consent, pre-op evaluation, timeout performed, IV checked, risks and benefits discussed and monitors and equipment checked Spinal Block Patient position: sitting Prep: ChloraPrep Patient monitoring: heart rate, continuous pulse ox, blood pressure and cardiac monitor Approach: midline Location: L2-3 Injection technique: single-shot Needle Needle type: Whitacre and Introducer  Needle gauge: 24 G Needle length: 9 cm Assessment Sensory level: T10 Additional Notes Negative paresthesia. Negative blood return. Positive free-flowing CSF. Expiration date of kit checked and confirmed. Patient tolerated procedure well, without complications.

## 2017-03-28 NOTE — Discharge Summary (Signed)
OB Discharge Summary     Patient Name: Sharon Weaver DOB: 08/09/1986 MRN: [id number]  Date of admission: 03/28/2017 Delivering MD: Letitia Libra, MD  Date of Delivery: 03/28/2017  Date of discharge: 03/30/2017  Admitting diagnosis: 40wks C-sections HISTORY OF CESAREAN SECTION  DESIRE for STERILIZATION  Intrauterine pregnancy: [redacted]w[redacted]d     Secondary diagnosis: None     Discharge diagnosis: Term Pregnancy Delivered and cesarean section for repeat and tubal ligation for sterility                                                                                      Post partum procedures:none  Augmentation: No labor  Complications: None  Hospital course:  Sceduled C/S   31 y.o. yo G3P3002 at [redacted]w[redacted]d was admitted to the hospital 03/28/2017 for scheduled cesarean section with the following indication:Elective Repeat.  Membrane Rupture at time of CS  Patient delivered a Viable infant.03/28/2017  Details of operation can be found in separate operative note.  Pateint had an uncomplicated postpartum course.  She is ambulating, tolerating a regular diet, passing flatus, and urinating well. Patient is discharged home in stable condition on  03/30/17        Physical exam  Vitals:   03/29/17 2055 03/30/17 0042 03/30/17 0500 03/30/17 0856  BP: (!) 111/57 115/65  119/70  Pulse: 81 77  78  Resp: 18 20    Temp: 98.6 F (37 C) (!) 97.5 F (36.4 C) 98.4 F (36.9 C) 98.1 F (36.7 C)  TempSrc: Oral Oral Oral Oral  SpO2:  98%    Weight:      Height:       General: alert, cooperative and no distress Lochia: appropriate Uterine Fundus: firm Incision: Healing well with no significant drainage, No significant erythema DVT Evaluation: No evidence of DVT seen on physical exam. Negative Homan's sign.  Labs: Lab Results  Component Value Date   WBC 10.5 03/29/2017   HGB 10.9 (L) 03/29/2017   HCT 31.4 (L) 03/29/2017   MCV 85.2 03/29/2017   PLT 115 (L) 03/29/2017   CMP Latest Ref Rng & Units  01/02/2016  Glucose 65 - 99 mg/dL 409(W)  BUN 6 - 20 mg/dL 5(L)  Creatinine 1.19 - 1.00 mg/dL 1.47  Sodium 829 - 562 mmol/L 143  Potassium 3.5 - 5.2 mmol/L 3.7  Chloride 96 - 106 mmol/L 101  CO2 18 - 29 mmol/L 27  Calcium 8.7 - 10.2 mg/dL 9.5   Discharge instruction: per After Visit Summary.  Medications:  Allergies as of 03/30/2017   No Known Allergies     Medication List    TAKE these medications   calcium carbonate 500 MG chewable tablet Commonly known as:  TUMS - dosed in mg elemental calcium Chew 1 tablet by mouth as needed.   cyanocobalamin 1000 MCG tablet Take 1,000 mcg by mouth daily.   HYDROcodone-acetaminophen 5-325 MG tablet Commonly known as:  NORCO/VICODIN Take 1 tablet by mouth every 6 (six) hours as needed.   magnesium oxide 400 MG tablet Commonly known as:  MAG-OX Take 400 mg by mouth daily.   MULTI-VITAMINS Tabs Take 1 tablet by mouth daily.  Vitamin D (Cholecalciferol) 1000 units Tabs Take 1,000 Units by mouth daily.     Diet: routine diet  Activity: Advance as tolerated. Pelvic rest for 6 weeks.   Outpatient follow up: Follow-up Information    Nadara MustardHarris, Robert P, MD. Go in 1 week(s).   Specialty:  Obstetrics and Gynecology Contact information: [address] [place] [address] [phone]            Postpartum contraception: Tubal Ligation Rhogam Given postpartum: no Rubella vaccine given postpartum: no Varicella vaccine given postpartum: no TDaP given antepartum or postpartum: yes  Newborn Data: Live born female  Birth Weight: 7 lb 9 oz (3430 g) APGAR: 9, 9  Baby Feeding: Breast  Disposition:home with mother  SIGNED:  Letitia Libraobert Paul Harris, MD 03/30/2017 8:58 AM

## 2017-03-28 NOTE — Discharge Instructions (Signed)

## 2017-03-28 NOTE — Progress Notes (Signed)
Chaplain was making rounds and stop in to check on patient's well-being. Patient was happy and in good spirits and was feeding her baby. Patient  Stated that she was doing well and that she did not have any concerns.

## 2017-03-28 NOTE — Anesthesia Preprocedure Evaluation (Signed)
Anesthesia Evaluation  Patient identified by MRN, date of birth, ID band Patient awake    Reviewed: Allergy & Precautions, H&P , NPO status , Patient's Chart, lab work & pertinent test results, reviewed documented beta blocker date and time   History of Anesthesia Complications Negative for: history of anesthetic complications  Airway Mallampati: I  TM Distance: >3 FB Neck ROM: full    Dental  (+) Teeth Intact, Dental Advidsory Given   Pulmonary neg pulmonary ROS, former smoker,           Cardiovascular Exercise Tolerance: Good (-) hypertension(-) angina(-) CAD, (-) Past MI, (-) Cardiac Stents and (-) CABG + dysrhythmias (AVNRT) (-) Valvular Problems/Murmurs     Neuro/Psych negative neurological ROS  negative psych ROS   GI/Hepatic Neg liver ROS, GERD  ,  Endo/Other  negative endocrine ROS  Renal/GU negative Renal ROS  negative genitourinary   Musculoskeletal   Abdominal   Peds  Hematology negative hematology ROS (+)   Anesthesia Other Findings Past Medical History: No date: Dysfunctional uterine bleeding No date: Headache   Reproductive/Obstetrics negative OB ROS                             Anesthesia Physical Anesthesia Plan  ASA: II  Anesthesia Plan: Spinal   Post-op Pain Management:    Induction:   PONV Risk Score and Plan:   Airway Management Planned:   Additional Equipment:   Intra-op Plan:   Post-operative Plan:   Informed Consent: I have reviewed the patients History and Physical, chart, labs and discussed the procedure including the risks, benefits and alternatives for the proposed anesthesia with the patient or authorized representative who has indicated his/her understanding and acceptance.   Dental Advisory Given  Plan Discussed with: Anesthesiologist, CRNA and Surgeon  Anesthesia Plan Comments:         Anesthesia Quick Evaluation

## 2017-03-28 NOTE — Lactation Note (Signed)
This note was copied from a baby's chart. Lactation Consultation Note  Patient Name: Sharon Weaver Reason for consult: Initial assessment   Maternal Data Formula Feeding for Exclusion: No  Feeding Feeding Type: Breast Fed Length of feed: 10 min  LATCH Score/Interventions Latch: Grasps breast easily, tongue down, lips flanged, rhythmical sucking.  Audible Swallowing: Spontaneous and intermittent  Type of Nipple: Everted at rest and after stimulation  Comfort (Breast/Nipple): Soft / non-tender     Hold (Positioning): Assistance needed to correctly position infant at breast and maintain latch. Intervention(s): Breastfeeding basics reviewed  LATCH Score: 9  Lactation Tools Discussed/Used WIC Program: Yes   Consult Status Consult Status: Complete    Trudee GripCarolyn P Marializ Ferrebee Weaver, 1:08 PM

## 2017-03-28 NOTE — Transfer of Care (Signed)
Immediate Anesthesia Transfer of Care Note  Patient: Sharon Weaver  Procedure(s) Performed: Procedure(s): CESAREAN SECTION WITH BILATERAL TUBAL LIGATION (Bilateral)  Patient Location: PACU  Anesthesia Type:Spinal  Level of Consciousness: awake  Airway & Oxygen Therapy: Patient Spontanous Breathing and Patient connected to nasal cannula oxygen  Post-op Assessment: Report given to RN and Post -op Vital signs reviewed and stable  Post vital signs: Reviewed and stable  Last Vitals:  Vitals:   03/28/17 0559 03/28/17 0713  BP: 126/76 123/81  Pulse: 86 75  Resp: 18 16  Temp: (!) 36.4 C 36.7 C    Last Pain:  Vitals:   03/28/17 0713  TempSrc: Oral  PainSc: 0-No pain         Complications: No apparent anesthesia complications

## 2017-03-28 NOTE — OB Triage Note (Signed)
Patient came in for schedule repeat c-section. Denies contractions. Denies vaginal bleeding and vaginal discharge.

## 2017-03-28 NOTE — Anesthesia Post-op Follow-up Note (Cosign Needed)
Anesthesia QCDR form completed.        

## 2017-03-29 ENCOUNTER — Telehealth: Payer: Self-pay

## 2017-03-29 LAB — TYPE AND SCREEN
ABO/RH(D): AB POS
Antibody Screen: POSITIVE
Extend sample reason: UNDETERMINED
PT AG TYPE: POSITIVE
Unit division: 0
Unit division: 0

## 2017-03-29 LAB — SURGICAL PATHOLOGY

## 2017-03-29 LAB — CBC
HEMATOCRIT: 31.4 % — AB (ref 35.0–47.0)
Hemoglobin: 10.9 g/dL — ABNORMAL LOW (ref 12.0–16.0)
MCH: 29.6 pg (ref 26.0–34.0)
MCHC: 34.7 g/dL (ref 32.0–36.0)
MCV: 85.2 fL (ref 80.0–100.0)
Platelets: 115 10*3/uL — ABNORMAL LOW (ref 150–440)
RBC: 3.69 MIL/uL — AB (ref 3.80–5.20)
RDW: 16.2 % — ABNORMAL HIGH (ref 11.5–14.5)
WBC: 10.5 10*3/uL (ref 3.6–11.0)

## 2017-03-29 LAB — BPAM RBC
Blood Product Expiration Date: 201807252359
Blood Product Expiration Date: 201807312359
ISSUE DATE / TIME: 201807191348
UNIT TYPE AND RH: 600
UNIT TYPE AND RH: 600

## 2017-03-29 LAB — RPR: RPR Ser Ql: NONREACTIVE

## 2017-03-29 MED ORDER — OXYCODONE HCL 5 MG PO TABS: 5.0000 mg | ORAL_TABLET | ORAL | Status: DC | PRN

## 2017-03-29 MED ORDER — IBUPROFEN 600 MG PO TABS
600.0000 mg | ORAL_TABLET | Freq: Four times a day (QID) | ORAL | Status: DC | PRN
  Administered 2017-03-29 – 2017-03-30 (×4): 600 mg via ORAL
  Filled 2017-03-29 (×4): qty 1

## 2017-03-29 NOTE — Lactation Note (Signed)
This note was copied from a baby's chart. Lactation Consultation Note  Patient Name: Sharon Weaver ZOXWR'UToday's Date: 03/29/2017 Reason for consult: Follow-up assessment;Breast/nipple pain Baby latching to tip of nipple and then pulling breast in, breasts fuller today and baby not getting enough breast in mouth   Maternal Data Formula Feeding for Exclusion: No Has patient been taught Hand Expression?: Yes Does the patient have breastfeeding experience prior to this delivery?: Yes Mom shown how to shape breast to get a deeper latch which decreased pain on initial latch and then while breastfeeding, encouraged to massage breast while nursing to decrease firmness Feeding Feeding Type: Breast Fed Length of feed: 15 min (left breast )  LATCH Score/Interventions Latch: Grasps breast easily, tongue down, lips flanged, rhythmical sucking. Intervention(s): Assist with latch  Audible Swallowing: Spontaneous and intermittent Intervention(s): Alternate breast massage  Type of Nipple: Everted at rest and after stimulation  Comfort (Breast/Nipple): Filling, red/small blisters or bruises, mild/mod discomfort  Problem noted: Filling Interventions (Filling): Massage Interventions (Mild/moderate discomfort): Comfort gels (coconut oil, positioning)  Hold (Positioning): Assistance needed to correctly position infant at breast and maintain latch.  LATCH Score: 8  Lactation Tools Discussed/Used WIC Program: No   Consult Status Consult Status: Follow-up Date: 03/30/17 Follow-up type: In-patient    Sharon Weaver 03/29/2017, 12:39 PM

## 2017-03-29 NOTE — Progress Notes (Signed)
  Subjective:  Pain well controlled, not using narcotics.  Some burning noted at incision site.  Minimal to moderate lochia.  No fevers, no chills.  Tolerating po without N/V  Objective:  Blood pressure 121/69, pulse 76, temperature 98.4 F (36.9 C), temperature source Oral, resp. rate 17, height 5\' 4"  (1.626 m), weight 184 lb (83.5 kg), last menstrual period 06/16/2016, SpO2 100 %, unknown if currently breastfeeding.  General: NAD Pulmonary: no increased work of breathing Abdomen: non-distended, non-tender, fundus firm at level of umbilicus Incision: D/C/I Extremities: no edema, no erythema, no tenderness  Results for orders placed or performed during the hospital encounter of 03/28/17 (from the past 72 hour(s))  CBC     Status: Abnormal   Collection Time: 03/28/17  6:21 AM  Result Value Ref Range   WBC 8.1 3.6 - 11.0 K/uL   RBC 4.04 3.80 - 5.20 MIL/uL   Hemoglobin 11.7 (L) 12.0 - 16.0 g/dL   HCT 62.934.2 (L) 52.835.0 - 41.347.0 %   MCV 84.8 80.0 - 100.0 fL   MCH 28.9 26.0 - 34.0 pg   MCHC 34.1 32.0 - 36.0 g/dL   RDW 24.416.4 (H) 01.011.5 - 27.214.5 %   Platelets 150 150 - 440 K/uL  RPR     Status: None   Collection Time: 03/28/17  6:21 AM  Result Value Ref Range   RPR Ser Ql Non Reactive Non Reactive    Comment: (NOTE) Performed At: [facility] [address] [place], [place] [id number] Mila HomerHancock William F MD VQ:[id number]:[phone]   CBC     Status: Abnormal   Collection Time: 03/29/17  6:31 AM  Result Value Ref Range   WBC 10.5 3.6 - 11.0 K/uL   RBC 3.69 (L) 3.80 - 5.20 MIL/uL   Hemoglobin 10.9 (L) 12.0 - 16.0 g/dL   HCT 43.331.4 (L) 29.535.0 - 18.847.0 %   MCV 85.2 80.0 - 100.0 fL   MCH 29.6 26.0 - 34.0 pg   MCHC 34.7 32.0 - 36.0 g/dL   RDW 41.616.2 (H) 60.611.5 - 30.114.5 %   Platelets 115 (L) 150 - 440 K/uL     Assessment:   31 y.o. [id number] postoperativeday # 1 RLTCS & BTL   Plan:  1) Acute blood loss anemia - hemodynamically stable and asymptomatic - po ferrous sulfate  2) Blood Type --/--/AB POS  (07/19 [id number]) / Rubella Immune (12/08 0000) / Varicella Immune  3) TDAP status 01/23/17  4) Breast/s/p BTL  5) Disposition - anticipated discharge POD3

## 2017-03-29 NOTE — Anesthesia Postprocedure Evaluation (Signed)
Anesthesia Post Note  Patient: Sharon Weaver  Procedure(s) Performed: Procedure(s) (LRB): CESAREAN SECTION WITH BILATERAL TUBAL LIGATION (Bilateral)  Patient location during evaluation: [facility] Anesthesia Type: Spinal Level of consciousness: awake and awake and alert Pain management: pain level controlled Vital Signs Assessment: post-procedure vital signs reviewed and stable Respiratory status: spontaneous breathing Cardiovascular status: blood pressure returned to baseline and stable Postop Assessment: no backache and no headache Anesthetic complications: no Comments: Complains of moderate incisional pain.  Denies back pain.  Moves feet and legs.  Has not ambulated or voided.  VSS     Last Vitals:  Vitals:   03/28/17 2359 03/29/17 0435  BP: 116/62 118/67  Pulse: 86 77  Resp: 14 14  Temp: 37 C 36.6 C    Last Pain:  Vitals:   03/29/17 0652  TempSrc:   PainSc: 7                  Vernie MurdersPope,  Kaelan Emami G

## 2017-03-29 NOTE — Telephone Encounter (Signed)
FMLA/DISABILITY form [facility] [employer] filled out and given to [place] for procesing.

## 2017-03-29 NOTE — Anesthesia Post-op Follow-up Note (Signed)
  Anesthesia Pain Follow-up Note  Patient: Sharon Weaver  Day #: 1  Date of Follow-up: 03/29/2017 Time: 7:25 AM  Last Vitals:  Vitals:   03/28/17 2359 03/29/17 0435  BP: 116/62 118/67  Pulse: 86 77  Resp: 14 14  Temp: 37 C 36.6 C    Level of Consciousness: alert  Pain: moderate   Side Effects:None  Catheter Site Exam:clean     Plan: D/C from anesthesia care at surgeon's request  Vernie MurdersPope,  Mykaela Arena G

## 2017-03-30 MED ORDER — HYDROCODONE-ACETAMINOPHEN 5-325 MG PO TABS: 1.0000 | ORAL_TABLET | Freq: Four times a day (QID) | ORAL | 0 refills | Status: DC | PRN

## 2017-03-30 NOTE — Progress Notes (Signed)
Patient discharged home with infant. Discharge instructions, prescriptions and follow up appointment given to and reviewed with patient. Patient verbalized understanding. Pt wheeled out via staff with baby.

## 2017-04-01 ENCOUNTER — Telehealth: Payer: Self-pay

## 2017-04-01 ENCOUNTER — Ambulatory Visit (INDEPENDENT_AMBULATORY_CARE_PROVIDER_SITE_OTHER): Payer: 59 | Admitting: Obstetrics and Gynecology

## 2017-04-01 VITALS — BP 128/86 | Wt 168.0 lb

## 2017-04-01 DIAGNOSIS — Z09 Encounter for follow-up examination after completed treatment for conditions other than malignant neoplasm: Secondary | ICD-10-CM

## 2017-04-01 NOTE — Telephone Encounter (Signed)
Pt is unable to get one tube of pain pump out due to it becoming stuck. Talked with SDJ regarding and pt is coming in at 1:30 to have us remove. SDJ aware

## 2017-04-01 NOTE — Progress Notes (Signed)
   Postoperative Follow-up Patient presents post op from repeat c-section, BTL 5days ago for history of prior c-section and desires permanent sterilization.  Subjective: Presents due to not being able to get one of the OnQ pump catheters out.  Eating a regular diet without difficulty. The patient is not having any pain.  Activity: increasing slowly.  Denies fevers, chills, nausea, vomiting.  She is having occasional headaches she relates to position.  No elevated BPs.  Objective: Vitals:   04/01/17 1346  BP: 128/86   Vital Signs: BP 128/86   Wt 168 lb (76.2 kg)   BMI 28.84 kg/m  Constitutional: Well nourished, well developed female in no acute distress.  HEENT: normal Skin: Warm and dry.  Extremity: no edema  Abdomen: soft, nontender.  Single onQ catheter in place removed with minimum tension.  Bandage placed over cathter site.   clean, dry, intact and without erythema, induration, warmth, and tenderness   Assessment: 31 y.o. s/p repeat c-section and BTL progressing well  Plan: Patient has done well after surgery with no apparent complications.  I have discussed the post-operative course to date, and the expected progress moving forward.  The patient understands what complications to be concerned about.  I will see the patient in routine follow up, or sooner if needed.    Return in about 5 weeks (around 05/06/2017) for Dr Tiburcio PeaHarris for 6 week postpartum.   Thomasene MohairStephen Hanin Decook, MD 04/01/2017, 1:51 PM 'kl

## 2017-04-03 ENCOUNTER — Ambulatory Visit: Payer: 59 | Admitting: Obstetrics & Gynecology

## 2017-04-19 ENCOUNTER — Telehealth: Payer: Self-pay

## 2017-04-19 NOTE — Telephone Encounter (Signed)
Pt states she had a C-Section. Her claims representative advised her to contact us & ask that the release date be changed to state 6-8 wks & not a specific date. Pt is allowed up to 8 wks for c-section. Fax form to [employer]oline Pearce [phone].

## 2017-04-19 NOTE — Telephone Encounter (Signed)
Pt states she has a correction on her Short term disability claim form [facility] that she needs to discuss. [phone]#[phone]

## 2017-04-24 NOTE — Telephone Encounter (Signed)
Form was corrected per patient request & refaxed on 04/19/17.

## 2017-05-06 ENCOUNTER — Encounter: Payer: Self-pay | Admitting: Obstetrics & Gynecology

## 2017-05-06 ENCOUNTER — Ambulatory Visit (INDEPENDENT_AMBULATORY_CARE_PROVIDER_SITE_OTHER): Payer: 59 | Admitting: Obstetrics & Gynecology

## 2017-05-06 ENCOUNTER — Ambulatory Visit: Payer: 59 | Admitting: Obstetrics & Gynecology

## 2017-05-06 VITALS — BP 100/68 | HR 71 | Ht 64.0 in | Wt 157.0 lb

## 2017-05-06 DIAGNOSIS — O34219 Maternal care for unspecified type scar from previous cesarean delivery: Secondary | ICD-10-CM

## 2017-05-06 NOTE — Patient Instructions (Signed)

## 2017-05-06 NOTE — Progress Notes (Signed)
  OBSTETRICS POSTPARTUM CLINIC PROGRESS NOTE  Subjective:     Sharon Weaver is a 31 y.o. G72P3002 female who presents for a postpartum visit. She is 6 weeks postpartum following a Term pregnancy and delivery by C-section.  I have fully reviewed the prenatal and intrapartum course. Anesthesia: spinal.  Postpartum course has been complicated by uncomplicated.  Baby is feeding by Breast.  Bleeding: patient has not  resumed menses.  Bowel function is normal. Bladder function is normal.  Patient is not sexually active. Contraception method desired is tubal ligation.  Postpartum depression screening: negative. Edinburgh 0.  The following portions of the patient's history were reviewed and updated as appropriate: allergies, current medications, past family history, past medical history, past social history, past surgical history and problem list.  Review of Systems Pertinent items are noted in HPI.  Objective:    BP 100/68   Pulse 71   Ht 5\' 4"  (1.626 m)   Wt 157 lb (71.2 kg)   BMI 26.95 kg/m   General:  alert and no distress   Breasts:  inspection negative, no nipple discharge or bleeding, no masses or nodularity palpable  Lungs: clear to auscultation bilaterally  Heart:  regular rate and rhythm, S1, S2 normal, no murmur, click, rub or gallop  Abdomen: soft, non-tender; bowel sounds normal; no masses,  no organomegaly.   Well healed Pfannenstiel incision   Vulva:  normal  Vagina: normal vagina, no discharge, exudate, lesion, or erythema  Cervix:  no cervical motion tenderness and no lesions  Corpus: normal size, contour, position, consistency, mobility, non-tender  Adnexa:  normal adnexa and no mass, fullness, tenderness  Rectal Exam: Not performed.          Assessment:  Post Partum Care visit 1. History of cesarean delivery affecting pregnancy  2. Delivery by cesarean section of full-term infant  3. Postpartum care and examination - IGP, rfx Aptima HPV ASCU   Plan:  See  orders and Patient Instructions Resume all normal activities Follow up in: 1 year or as needed.   Annamarie Major, MD, [facility], [facility] 05/06/2017  1:43 PM

## 2017-05-07 LAB — IGP, RFX APTIMA HPV ASCU: PAP Smear Comment: 0

## 2017-07-28 ENCOUNTER — Other Ambulatory Visit: Payer: Self-pay

## 2017-07-28 ENCOUNTER — Emergency Department: Payer: 59

## 2017-07-28 DIAGNOSIS — Z87891 Personal history of nicotine dependence: Secondary | ICD-10-CM | POA: Insufficient documentation

## 2017-07-28 DIAGNOSIS — Z79899 Other long term (current) drug therapy: Secondary | ICD-10-CM | POA: Diagnosis not present

## 2017-07-28 DIAGNOSIS — R002 Palpitations: Secondary | ICD-10-CM | POA: Diagnosis not present

## 2017-07-28 LAB — CBC
HEMATOCRIT: 37.8 % (ref 35.0–47.0)
Hemoglobin: 13 g/dL (ref 12.0–16.0)
MCH: 28.7 pg (ref 26.0–34.0)
MCHC: 34.5 g/dL (ref 32.0–36.0)
MCV: 83.2 fL (ref 80.0–100.0)
Platelets: 188 10*3/uL (ref 150–440)
RBC: 4.54 MIL/uL (ref 3.80–5.20)
RDW: 13.9 % (ref 11.5–14.5)
WBC: 7.6 10*3/uL (ref 3.6–11.0)

## 2017-07-28 LAB — TROPONIN I: Troponin I: <0.03 ng/mL (ref <0.03)

## 2017-07-28 LAB — BASIC METABOLIC PANEL
Anion gap: 5 (ref 5–15)
BUN: 16 mg/dL (ref 6–20)
CO2: 27 mmol/L (ref 22–32)
CREATININE: 0.75 mg/dL (ref 0.44–1.00)
Calcium: 9.3 mg/dL (ref 8.9–10.3)
Chloride: 101 mmol/L (ref 101–111)
GFR calc Af Amer: >60 mL/min (ref >60)
GLUCOSE: 103 mg/dL — AB (ref 65–99)
POTASSIUM: 3.6 mmol/L (ref 3.5–5.1)
Sodium: 133 mmol/L — ABNORMAL LOW (ref 135–145)

## 2017-07-28 NOTE — ED Notes (Signed)
Pt ambulatory with steady gait to registration area for recheck of vital signs; pt waiting patiently for treatment room; denies chest pain at this time; reports intermittent fluttering sensation to the top middle chest area; updated on the wait time/delay; verbalized understanding

## 2017-07-28 NOTE — ED Triage Notes (Signed)
Pt states that approx an hour ago she experienced palpitations that were hard and caused her to have chest pain, pt states that she has been diagnosed with SVT in the past

## 2017-07-29 ENCOUNTER — Emergency Department
Admission: EM | Admit: 2017-07-29 | Discharge: 2017-07-29 | Disposition: A | Discharge status: Home / Self Care | Payer: 59 | Attending: Emergency Medicine | Admitting: Emergency Medicine

## 2017-07-29 DIAGNOSIS — R002 Palpitations: Secondary | ICD-10-CM

## 2017-07-29 LAB — TSH: TSH: 2.141 u[IU]/mL (ref 0.350–4.500)

## 2017-07-29 LAB — FIBRIN DERIVATIVES D-DIMER (ARMC ONLY): FIBRIN DERIVATIVES D-DIMER (ARMC): 209.02 ng{FEU}/mL (ref 0.00–499.00)

## 2017-07-29 LAB — TROPONIN I

## 2017-07-29 NOTE — ED Notes (Signed)
Pt reports palpations and fluttering of the heart today.  Chest pain earlier today.   Intermittent sob.  No cough.  Nonsmoker.  nsr on monitor.  Family with pt.  Pt alert.

## 2017-07-29 NOTE — ED Notes (Signed)
Report off to April rn  

## 2017-07-29 NOTE — ED Provider Notes (Signed)
[facility] Emergency Department Provider Note   ____________________________________________   First MD Initiated Contact with Patient 07/29/17 0130     (approximate)  I have reviewed the triage vital signs and the nursing notes.   HISTORY  Chief Complaint Palpitations    HPI Sharon Weaver is a 31 y.o. female who presents to the ED from home with a chief complaint of heart palpitations.  Patient reports resting at home, bent her head down and began to feel fast and regular palpitations.  Episode lasted approximately 2 minutes and associated with chest pressure and shortness of breath.  Patient has had several similar episodes previously.  Seen by cardiologist Dr. Gwen PoundsKowalski who diagnosed her with clinical SVT.  She has never worn a Holter monitor.  Patient is 4 months postpartum.  Has had recent cold-like symptoms but denies taking decongestants.  Denies fever, chills, abdominal pain, nausea, vomiting, dysuria.  Denies recent travel, trauma or hormone use.  Caffeine use is limited to one cup of coffee and 2 cups of tea daily.  Had a little bit of chocolate tonight.  Denies energy drinks or herbal medications.   Past Medical History:  Diagnosis Date  . Dysfunctional uterine bleeding   . Headache     Patient Active Problem List   Diagnosis Date Noted  . Postpartum care and examination 05/06/2017  . Delivery by cesarean section of full-term infant 03/28/2017  . 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 Periurethral cyst 12/06/2016  . History of cesarean delivery affecting pregnancy 12/03/2016  . B12 nutritional deficiency 07/17/2016  . Paroxysmal supraventricular tachycardia (HCC) 06/11/2016  . 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 Hypokalemia 01/02/2016  . Abnormal TSH 01/02/2016  . Dizziness 01/02/2016  . History of palpitations 12/06/2015  . 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 CESAREAN SECTION  09/11/10,  03/10/2013  . CESAREAN SECTION WITH BILATERAL TUBAL LIGATION Bilateral 03/28/2017   Performed by Nadara MustardHarris, Robert P, [facility]  . URETHRAL DIVERTICULUM REPAIR  01/2016    Prior to Admission medications   Medication Sig Start Date End Date Taking? Authorizing Provider  calcium carbonate (TUMS - DOSED IN MG ELEMENTAL CALCIUM) 500 MG chewable tablet Chew 1 tablet by mouth as needed.     [provider]  cyanocobalamin 1000 MCG tablet Take 1,000 mcg by mouth daily.    [provider]  HYDROcodone-acetaminophen (NORCO/VICODIN) 5-325 MG tablet Take 1 tablet by mouth every 6 (six) hours as needed. Patient not taking: Reported on 05/06/2017 03/30/17   Nadara MustardHarris, Robert P, MD  magnesium oxide (MAG-OX) 400 MG tablet Take 400 mg by mouth daily.     [provider]  Multiple Vitamin (MULTI-VITAMINS) TABS Take 1 tablet by mouth daily.     [provider]  Vitamin D, Cholecalciferol, 1000 units TABS Take 1,000 Units by mouth daily.     [provider]    Allergies Patient has no known allergies.  Family History  Problem Relation Age of Onset  . Cervical cancer Mother 446  . Cancer Maternal Grandmother   . Lung cancer Maternal Grandfather   . Diabetes Paternal Grandfather     Social History Social History   Tobacco Use  . Smoking status: Former Smoker    Packs/day: 1.00    Types: Cigarettes    Last attempt to quit: 12/10/2007    Years since quitting: 9.6  . Smokeless tobacco: Never Used  Substance Use Topics  . Alcohol use:  No    Alcohol/week: 4.8 oz    Types: 8 Glasses of wine per week    Comment: weekly  . Drug use: No    Review of Systems  Constitutional: No fever/chills. Eyes: No visual changes. ENT: No sore throat. Cardiovascular: Positive for chest pain and palpitations. Respiratory: Positive for shortness of breath. Gastrointestinal: No abdominal pain.  No nausea, no vomiting.  No diarrhea.  No constipation. Genitourinary: Negative  for dysuria. Musculoskeletal: Negative for back pain. Skin: Negative for rash. Neurological: Negative for headaches, focal weakness or numbness.   ____________________________________________   PHYSICAL EXAM:  VITAL SIGNS: ED Triage Vitals  Enc Vitals Group     BP 07/28/17 2111 129/87     Pulse Rate 07/28/17 2111 99     Resp 07/28/17 2111 18     Temp 07/28/17 2111 98.2 F (36.8 C)     Temp Source 07/28/17 2111 Oral     SpO2 07/28/17 2111 100 %     Weight 07/28/17 2112 152 lb (68.9 kg)     Height 07/28/17 2112 5\' 4"  (1.626 m)     Head Circumference --      Peak Flow --      Pain Score 07/28/17 2111 2     Pain Loc --      Pain Edu? --      Excl. in GC? --     Constitutional: Alert and oriented. Well appearing and in no acute distress. Eyes: Conjunctivae are normal. PERRL. EOMI. Head: Atraumatic. Nose: No congestion/rhinnorhea. Mouth/Throat: Mucous membranes are moist.  Oropharynx non-erythematous. Neck: No stridor.  No carotid bruits. Cardiovascular: Normal rate, regular rhythm. Grossly normal heart sounds.  Good peripheral circulation. Respiratory: Normal respiratory effort.  No retractions. Lungs CTAB. Gastrointestinal: Soft and nontender. No distention. No abdominal bruits. No CVA tenderness. Musculoskeletal: No lower extremity tenderness nor edema.  No joint effusions. Neurologic:  Normal speech and language. No gross focal neurologic deficits are appreciated. No gait instability. Skin:  Skin is warm, dry and intact. No rash noted. Psychiatric: Mood and affect are normal. Speech and behavior are normal.  ____________________________________________   LABS (all labs ordered are listed, but only abnormal results are displayed)  Labs Reviewed  BASIC METABOLIC PANEL - Abnormal; Notable for the following components:      Result Value   Sodium 133 (*)    Glucose, Bld 103 (*)    All other components within normal limits  CBC  TROPONIN I  TSH  TROPONIN I  FIBRIN  DERIVATIVES D-DIMER ([facility] ONLY)   ____________________________________________  EKG  ED ECG REPORT I, SUNG,JADE J, the attending physician, personally viewed and interpreted this ECG.   Date: 07/29/2017  EKG Time: 2111  Rate: 92  Rhythm: normal EKG, normal sinus rhythm  Axis: Normal  Intervals:none  ST&T Change: Nonspecific  ____________________________________________  RADIOLOGY  Dg Chest 2 View  Result Date: 07/28/2017 CLINICAL DATA:  Cardiac palpitations EXAM: CHEST  2 VIEW COMPARISON:  None. FINDINGS: The heart size and mediastinal contours are within normal limits. Both lungs are clear. The visualized skeletal structures are unremarkable. IMPRESSION: No active cardiopulmonary disease. Electronically Signed   By: Alcide CleverMark  Lukens M.D.   On: 07/28/2017 21:55    ____________________________________________   PROCEDURES  Procedure(s) performed: None  Procedures  Critical Care performed: No  ____________________________________________   INITIAL IMPRESSION / ASSESSMENT AND PLAN / ED COURSE  As part of my medical decision making, I reviewed the following data within the electronic MEDICAL RECORD NUMBER  History obtained from family, Nursing notes reviewed and incorporated, Labs reviewed, EKG interpreted, Old chart reviewed, Radiograph reviewed and Notes from prior ED visits.   31 year old female with a history of clinical SVT presenting with palpitations associated with chest pain and shortness of breath.  She is 4 months postpartum and breast-feeding. Differential diagnosis includes, but is not limited to, ACS, aortic dissection, pulmonary embolism, cardiac tamponade, pneumothorax, pneumonia, pericarditis, myocarditis, GI-related causes including esophagitis/gastritis, and musculoskeletal chest wall pain.    Initial lab work including troponin unremarkable.  EKG and chest x-Deupree are within normal limits.  Patient has not had recurrence of palpitations and her monitor has been  consistent with normal sinus rhythm during her ED stay.  Will check repeat, timed troponin, d-dimer, TSH.  ----------------------------------------- 2:01 AM on 07/29/2017 -----------------------------------------   OBSERVATION CARE: This patient is being placed under observation care for the following reasons: Chest pain with repeat testing to rule out ischemia    Clinical Course as of Jul 30 351  [facility] Jul 29, 2017  0306 Patient resting in no acute distress.  Voices no complaints of palpitations, chest pain or shortness of breath.  [JS]    Clinical Course User Index [JS] Irean Hong, MD    ----------------------------------------- 3:52 AM on 07/29/2017 -----------------------------------------   END OF OBSERVATION STATUS: After an appropriate period of observation, this patient is being discharged due to the following reason(s): Repeat negative troponin, negative TSH and d-dimer.  Patient without recurrence of palpitations.  Will discharge home to follow-up with her cardiologist Dr. Gwen Pounds.  She will likely need Holter monitoring.  Strict return precautions given.  Patient and spouse verbalize understanding and agree with plan of care. ____________________________________________   FINAL CLINICAL IMPRESSION(S) / ED DIAGNOSES  Final diagnoses:  Heart palpitations     ED Discharge Orders    None       Note:  This document was prepared using Dragon voice recognition software and may include unintentional dictation errors.    Irean Hong, MD 07/29/17 0500

## 2017-07-29 NOTE — Discharge Instructions (Signed)
Speak with your cardiologist about wearing a Holter monitor.  Continue to avoid caffeine as much as possible.  Return to the ER for worsening symptoms, persistent vomiting, difficulty breathing or other concerns.

## 2017-08-23 ENCOUNTER — Ambulatory Visit: Payer: 59 | Admitting: Internal Medicine

## 2017-10-29 DIAGNOSIS — H5213 Myopia, bilateral: Secondary | ICD-10-CM | POA: Diagnosis not present

## 2017-11-28 ENCOUNTER — Telehealth: Payer: Self-pay | Admitting: Obstetrics and Gynecology

## 2017-11-28 NOTE — Telephone Encounter (Signed)
-----   Message from [place], [place] sent at 11/27/2017 8:15 PM EDT -----    Appointment Request From: Consuella LoseKaylee Gundry    With Provider: Thomasene MohairStephen Jackson, MD [[facility]]    Preferred Date Range: Any    Preferred Times: Any time    Reason for visit: Office Visit    Comments:  Constant cramping/pain in lower abdomen as if I have to urinate. Not similar to menstrual cramps. Symptoms started Monday-ish and have continued all week. No relief even after using restroom.    Called and left voicemail for patient to call back to be schedule

## 2017-12-03 ENCOUNTER — Ambulatory Visit (INDEPENDENT_AMBULATORY_CARE_PROVIDER_SITE_OTHER): Payer: 59 | Admitting: Obstetrics and Gynecology

## 2017-12-03 ENCOUNTER — Encounter: Payer: Self-pay | Admitting: Obstetrics and Gynecology

## 2017-12-03 VITALS — BP 112/60 | Ht 64.0 in | Wt 152.0 lb

## 2017-12-03 DIAGNOSIS — R102 Pelvic and perineal pain: Secondary | ICD-10-CM | POA: Diagnosis not present

## 2017-12-03 DIAGNOSIS — R350 Frequency of micturition: Secondary | ICD-10-CM | POA: Diagnosis not present

## 2017-12-03 LAB — POCT URINALYSIS DIPSTICK
BILIRUBIN UA: NEGATIVE
GLUCOSE UA: NEGATIVE
KETONES UA: NEGATIVE
Leukocytes, UA: NEGATIVE
Nitrite, UA: NEGATIVE
Protein, UA: NEGATIVE
RBC UA: NEGATIVE
SPEC GRAV UA: 1.025 (ref 1.010–1.025)
Urobilinogen, UA: NEGATIVE E.U./dL — AB
pH, UA: 7 (ref 5.0–8.0)

## 2017-12-03 NOTE — Progress Notes (Signed)
Obstetrics & Gynecology Office Visit   Chief Complaint  Patient presents with  . Pelvic Pain    frequent urination   History of Present Illness:32 y.o. [id number] with one week history of bilateral lower quadrant abdominal cramping.  She describes the pain as having to urinate really bad. She was having more pain on the right than the left.  She would void to feel relief. She was voiding more, and this did not relieve the pain.  Aggravating factors, include; pushing on her lower belly where the incision scar is located.  Nothing made the pain better.  She had no associated symptoms.  Her severity wsa 5/10.  The pain did not radiate.  The pain is improved compared to before.  Denies fevers, chills, nausea, vomiting, vaginal symptoms of itching, irritation, burning, abnormal discharge. Denies diarrhea, constipation.  Today the pain is improved, 0/10.  She still has urinary frequency.  She does not feel like she is getting any relief.  She denies hematuria.  Patient's last menstrual period was 11/10/2017 (exact date).  Her menses have been short-lasting, but very heavy.    Past Medical History:  Diagnosis Date  . Dysfunctional uterine bleeding   . Headache     Past Surgical History:  Procedure Laterality Date  . CESAREAN SECTION  09/11/10, 03/10/2013  . CESAREAN SECTION WITH BILATERAL TUBAL LIGATION Bilateral 03/28/2017   Procedure: CESAREAN SECTION WITH BILATERAL TUBAL LIGATION;  Surgeon: Nadara Mustard, MD;  Location: [facility] ORS;  Service: Obstetrics;  Laterality: Bilateral;  . URETHRAL DIVERTICULUM REPAIR  01/2016    Gynecologic History: Patient's last menstrual period was 11/10/2017 (exact date).  Obstetric History: [id number], s/p c-section x 3.    Family History  Problem Relation Age of Onset  . Cervical cancer Mother 59  . Cancer Maternal Grandmother   . Lung cancer Maternal Grandfather   . Diabetes Paternal Grandfather     Social History   Socioeconomic History  . Marital  status: Married    Spouse name: Not on file  . Number of children: Not on file  . Years of education: Not on file  . Highest education level: Not on file  Occupational History  . Occupation: Advertising account planner  Social Needs  . Financial resource strain: Not on file  . Food insecurity:    Worry: Not on file    Inability: Not on file  . Transportation needs:    Medical: Not on file    Non-medical: Not on file  Tobacco Use  . Smoking status: Former Smoker    Packs/day: 1.00    Types: Cigarettes    Last attempt to quit: 12/10/2007    Years since quitting: 9.9  . Smokeless tobacco: Never Used  Substance and Sexual Activity  . Alcohol use: No    Alcohol/week: 4.8 oz    Types: 8 Glasses of wine per week    Comment: weekly  . Drug use: No  . Sexual activity: Yes    Birth control/protection: None  Lifestyle  . Physical activity:    Days per week: Not on file    Minutes per session: Not on file  . Stress: Not on file  Relationships  . Social connections:    Talks on phone: Not on file    Gets together: Not on file    Attends religious service: Not on file    Active member of club or organization: Not on file    Attends meetings of clubs or organizations: Not on file  Relationship status: Not on file  . Intimate partner violence:    Fear of current or ex partner: Not on file    Emotionally abused: Not on file    Physically abused: Not on file    Forced sexual activity: Not on file  Other Topics Concern  . Not on file  Social History Narrative  . Not on file    No Known Allergies  Prior to Admission medications   Medication Sig Start Date End Date Taking? Authorizing Provider  magnesium oxide (MAG-OX) 400 MG tablet Take 400 mg by mouth daily.    Yes [provider]  vitamin B-12 (CYANOCOBALAMIN) 1000 MCG tablet Take 1,000 mcg by mouth daily.   Yes [provider]  Vitamin D, Cholecalciferol, 1000 units TABS Take 1,000 Units by mouth daily.    Yes  [provider]  calcium carbonate (TUMS - DOSED IN MG ELEMENTAL CALCIUM) 500 MG chewable tablet Chew 1 tablet by mouth as needed.     [provider]  cyanocobalamin 1000 MCG tablet Take 1,000 mcg by mouth daily.    [provider]  Multiple Vitamin (MULTI-VITAMINS) TABS Take 1 tablet by mouth daily.     [provider]    Review of Systems  Constitutional: Negative.  Negative for chills, fever and malaise/fatigue.  HENT: Negative.   Eyes: Negative.   Respiratory: Negative.   Cardiovascular: Negative.   Gastrointestinal: Positive for abdominal pain (see HPI). Negative for blood in stool, constipation, diarrhea, heartburn, melena, nausea and vomiting.  Genitourinary: Positive for frequency. Negative for dysuria, flank pain, hematuria and urgency.  Musculoskeletal: Negative.   Skin: Negative.   Neurological: Negative.   Endo/Heme/Allergies: Negative.   Psychiatric/Behavioral: Negative.      Physical Exam BP 112/60   Ht 5\' 4"  (1.626 m)   Wt 152 lb (68.9 kg)   LMP 11/10/2017 (Exact Date)   Breastfeeding? No   BMI 26.09 kg/m  Patient's last menstrual period was 11/10/2017 (exact date). Physical Exam  Constitutional: She is oriented to person, place, and time. She appears well-developed and well-nourished. No distress.  HENT:  Head: Normocephalic and atraumatic.  Eyes: Conjunctivae are normal. No scleral icterus.  Cardiovascular: Normal rate and regular rhythm. Exam reveals no gallop and no friction rub.  No murmur heard. Pulmonary/Chest: Effort normal and breath sounds normal. No stridor. No respiratory distress. She has no wheezes. She has no rales.  Abdominal: Soft. Bowel sounds are normal. She exhibits no distension and no mass. There is no tenderness. There is no rebound and no guarding. No hernia.  Well-healed pfannenstiel incision is clean, dry, intact. Non-tender to palpation No CVAT  Musculoskeletal: Normal range of motion. She exhibits  no edema.  Neurological: She is alert and oriented to person, place, and time. No cranial nerve deficit.  Skin: Skin is warm and dry. No erythema.  Psychiatric: She has a normal mood and affect. Her behavior is normal. Judgment normal.   Female chaperone present for pelvic and breast  portions of the physical exam   Lab Results  Component Value Date   COLORU clear/yellow 12/03/2017   CLARITYU clear 12/03/2017   GLUCOSEUR neg 12/03/2017   BILIRUBINUR neg 12/03/2017   KETONESU neg 12/03/2017   SPECGRAV 1.025 12/03/2017   RBCUR neg 12/03/2017   PHUR 7.0 12/03/2017   PROTEINUR neg 12/03/2017    Assessment: 32 y.o. [id number] female here for  1. Urinary frequency   2. Pelvic pain      Plan: Problem List  Items Addressed This Visit    None    Visit Diagnoses    Urinary frequency    -  Primary   Relevant Orders   POCT Urinalysis Dipstick (Completed)   Urine Culture   Pelvic pain       Relevant Orders   POCT Urinalysis Dipstick (Completed)    New problem. Workup planned. Clinic UA not suggestive. Will send Urine C&S.  Follow up based on these results.  Thomasene Mohair, MD 12/03/2017 11:11 AM

## 2017-12-05 LAB — URINE CULTURE

## 2017-12-09 ENCOUNTER — Telehealth: Payer: Self-pay | Admitting: Obstetrics and Gynecology

## 2017-12-09 NOTE — Telephone Encounter (Signed)
Discussed negative urine culture. She is not having any symptoms currently. She will let me know, if symptoms recur.  Thomasene MohairStephen Braylin Formby, MD, Merlinda FrederickFACOG [facility], [facility] 12/09/2017 2:56 PM

## 2018-02-06 DIAGNOSIS — R05 Cough: Secondary | ICD-10-CM | POA: Diagnosis not present

## 2018-02-06 DIAGNOSIS — J029 Acute pharyngitis, unspecified: Secondary | ICD-10-CM | POA: Diagnosis not present

## 2018-04-18 ENCOUNTER — Ambulatory Visit: Payer: 59 | Admitting: Obstetrics and Gynecology

## 2018-05-07 ENCOUNTER — Ambulatory Visit (INDEPENDENT_AMBULATORY_CARE_PROVIDER_SITE_OTHER): Payer: 59 | Admitting: Obstetrics & Gynecology

## 2018-05-07 ENCOUNTER — Encounter: Payer: Self-pay | Admitting: Obstetrics & Gynecology

## 2018-05-07 VITALS — BP 110/70 | Ht 64.0 in | Wt 134.0 lb

## 2018-05-07 DIAGNOSIS — Z01419 Encounter for gynecological examination (general) (routine) without abnormal findings: Secondary | ICD-10-CM | POA: Diagnosis not present

## 2018-05-07 DIAGNOSIS — Z0000 Encounter for general adult medical examination without abnormal findings: Secondary | ICD-10-CM

## 2018-05-07 DIAGNOSIS — Z13220 Encounter for screening for lipoid disorders: Secondary | ICD-10-CM | POA: Diagnosis not present

## 2018-05-07 DIAGNOSIS — Z1321 Encounter for screening for nutritional disorder: Secondary | ICD-10-CM

## 2018-05-07 NOTE — Progress Notes (Signed)
HPI:      [name] is a 32 y.o. [phone] who LMP was Patient's last menstrual period was 05/01/2018., she presents today for her annual examination. The patient has no complaints today.  Periods reg and light.  The patient is sexually active. Her last pap: approximate date 2018 and was normal and last mammogram: patient has never had a mammogram. The patient does perform self breast exams.  There is no notable family history of breast or ovarian cancer in her family.  The patient has regular exercise: yes.  The patient denies current symptoms of depression.    GYN History: Contraception: tubal ligation  PMHx: Past Medical History:  Diagnosis Date  . Dysfunctional uterine bleeding   . Headache    Past Surgical History:  Procedure Laterality Date  . CESAREAN SECTION  09/11/10, 03/10/2013  . CESAREAN SECTION WITH BILATERAL TUBAL LIGATION Bilateral 03/28/2017   Procedure: CESAREAN SECTION WITH BILATERAL TUBAL LIGATION;  Surgeon: Nadara MustardHarris, Dimitra Woodstock P, MD;  Location: [facility] ORS;  Service: Obstetrics;  Laterality: Bilateral;  . URETHRAL DIVERTICULUM REPAIR  01/2016   Family History  Problem Relation Age of Onset  . Cervical cancer Mother 2046  . Cancer Maternal Grandmother   . Lung cancer Maternal Grandfather   . Diabetes Paternal Grandfather    Social History   Tobacco Use  . Smoking status: Former Smoker    Packs/day: 1.00    Types: Cigarettes    Last attempt to quit: 12/10/2007    Years since quitting: 10.4  . Smokeless tobacco: Never Used  Substance Use Topics  . Alcohol use: No    Alcohol/week: 8.0 standard drinks    Types: 8 Glasses of wine per week    Comment: weekly  . Drug use: No    Current Outpatient Medications:  .  calcium carbonate (TUMS - DOSED IN MG ELEMENTAL CALCIUM) 500 MG chewable tablet, Chew 1 tablet by mouth as needed. , Disp: , Rfl:  .  cyanocobalamin 1000 MCG tablet, Take 1,000 mcg by mouth daily., Disp: , Rfl:  .  magnesium oxide (MAG-OX) 400 MG tablet,  Take 400 mg by mouth daily. , Disp: , Rfl:  .  Multiple Vitamin (MULTI-VITAMINS) TABS, Take 1 tablet by mouth daily. , Disp: , Rfl:  .  vitamin B-12 (CYANOCOBALAMIN) 1000 MCG tablet, Take 1,000 mcg by mouth daily., Disp: , Rfl:  .  Vitamin D, Cholecalciferol, 1000 units TABS, Take 1,000 Units by mouth daily. , Disp: , Rfl:  Allergies: Patient has no known allergies.  ROS  Objective: BP 110/70   Ht 5\' 4"  (1.626 m)   Wt 134 lb (60.8 kg)   LMP 05/01/2018   BMI 23.00 kg/m   Filed Weights   05/07/18 1539  Weight: 134 lb (60.8 kg)   Body mass index is 23 kg/m. OBGyn Exam  Assessment:  ANNUAL EXAM 1. Annual physical exam   2. Screening for cholesterol level   3. Encounter for vitamin deficiency screening      Screening Plan:            1.  Cervical Screening-  Pap smear schedule reviewed with patient  2. Breast screening- Exam annually and mammogram>40 planned   3. Colonoscopy every 10 years, Hemoccult testing - after age 30  4. Labs To return fasting at a later date  TSH normal last year Vit B12 deficienct in past (will check cholesterol, D, B12)  5. Counseling for contraception: bilateral tubal ligation    F/U  Return  in about 1 year (around 05/08/2019) for Lab Appt SOON, then Annual.  Annamarie Major, MD, [facility], [facility] 05/07/2018  4:04 PM

## 2018-05-07 NOTE — Patient Instructions (Signed)
PAP every three years Labs soon   

## 2018-05-15 ENCOUNTER — Other Ambulatory Visit: Payer: 59

## 2018-05-15 DIAGNOSIS — Z13220 Encounter for screening for lipoid disorders: Secondary | ICD-10-CM

## 2018-05-15 DIAGNOSIS — Z1321 Encounter for screening for nutritional disorder: Secondary | ICD-10-CM | POA: Diagnosis not present

## 2018-05-17 LAB — LIPID PANEL
CHOL/HDL RATIO: 2.4 ratio (ref 0.0–4.4)
CHOLESTEROL TOTAL: 138 mg/dL (ref 100–199)
HDL: 58 mg/dL (ref >39)
LDL CALC: 69 mg/dL (ref 0–99)
TRIGLYCERIDES: 57 mg/dL (ref 0–149)
VLDL CHOLESTEROL CAL: 11 mg/dL (ref 5–40)

## 2018-05-17 LAB — VITAMIN D 25 HYDROXY (VIT D DEFICIENCY, FRACTURES): Vit D, 25-Hydroxy: 32.9 ng/mL (ref 30.0–100.0)

## 2018-05-17 LAB — VITAMIN B12: VITAMIN B 12: 554 pg/mL (ref 232–1245)

## 2018-08-18 DIAGNOSIS — I471 Supraventricular tachycardia: Secondary | ICD-10-CM | POA: Diagnosis not present

## 2018-10-09 DIAGNOSIS — R6889 Other general symptoms and signs: Secondary | ICD-10-CM | POA: Diagnosis not present

## 2018-11-06 ENCOUNTER — Ambulatory Visit (INDEPENDENT_AMBULATORY_CARE_PROVIDER_SITE_OTHER): Payer: 59 | Admitting: Maternal Newborn

## 2018-11-06 VITALS — BP 114/74 | Ht 64.0 in | Wt 132.0 lb

## 2018-11-06 DIAGNOSIS — N644 Mastodynia: Secondary | ICD-10-CM

## 2018-11-06 NOTE — Progress Notes (Signed)
Obstetrics & Gynecology Office Visit   Chief Complaint:  Chief Complaint  Patient presents with  . Breast Pain    History of Present Illness: Sharity noticed some intermittent sharp pain in her left breast beginning the first week of January. The pain is located around 2' o clock and radiates diagonally to about the middle of the nipple. It sometimes is tender to palpation in that area and sometimes not. She describes the pain as sharp and occasionally throbbing. It does not radiate to her axilla or midline. It has remained about the same, nothing makes it better or worse. She does not have any skin changes, rash, palpable masses, changes in the symmetry of her breasts, or nipple discharge.   Review of Systems: Review of systems negative unless otherwise noted in HPI  Past Medical History:  Past Medical History:  Diagnosis Date  . Dysfunctional uterine bleeding   . Headache     Past Surgical History:  Past Surgical History:  Procedure Laterality Date  . CESAREAN SECTION  09/11/10, 03/10/2013  . CESAREAN SECTION WITH BILATERAL TUBAL LIGATION Bilateral 03/28/2017   Procedure: CESAREAN SECTION WITH BILATERAL TUBAL LIGATION;  Surgeon: Nadara Mustard, MD;  Location: [facility] ORS;  Service: Obstetrics;  Laterality: Bilateral;  . URETHRAL DIVERTICULUM REPAIR  01/2016    Gynecologic History: Patient's last menstrual period was 10/11/2018.  Obstetric History: [id number]  Family History:  Family History  Problem Relation Age of Onset  . Cervical cancer Mother 63  . Cancer Maternal Grandmother   . Lung cancer Maternal Grandfather   . Diabetes Paternal Grandfather     Social History:  Social History   Socioeconomic History  . Marital status: Married    Spouse name: Not on file  . Number of children: Not on file  . Years of education: Not on file  . Highest education level: Not on file  Occupational History  . Occupation: Advertising account planner  Social Needs  . Financial resource  strain: Not on file  . Food insecurity:    Worry: Not on file    Inability: Not on file  . Transportation needs:    Medical: Not on file    Non-medical: Not on file  Tobacco Use  . Smoking status: Former Smoker    Packs/day: 1.00    Types: Cigarettes    Last attempt to quit: 12/10/2007    Years since quitting: 10.9  . Smokeless tobacco: Never Used  Substance and Sexual Activity  . Alcohol use: No    Alcohol/week: 8.0 standard drinks    Types: 8 Glasses of wine per week    Comment: weekly  . Drug use: No  . Sexual activity: Yes    Birth control/protection: Surgical  Lifestyle  . Physical activity:    Days per week: Not on file    Minutes per session: Not on file  . Stress: Not on file  Relationships  . Social connections:    Talks on phone: Not on file    Gets together: Not on file    Attends religious service: Not on file    Active member of club or organization: Not on file    Attends meetings of clubs or organizations: Not on file    Relationship status: Not on file  . Intimate partner violence:    Fear of current or ex partner: Not on file    Emotionally abused: Not on file    Physically abused: Not on file    Forced sexual  activity: Not on file  Other Topics Concern  . Not on file  Social History Narrative  . Not on file    Allergies:  No Known Allergies  Medications: Prior to Admission medications   Medication Sig Start Date End Date Taking? Authorizing Provider  cyanocobalamin 1000 MCG tablet Take 1,000 mcg by mouth daily.   Yes [provider]  magnesium oxide (MAG-OX) 400 MG tablet Take 400 mg by mouth daily.    Yes [provider]  Multiple Vitamin (MULTI-VITAMINS) TABS Take 1 tablet by mouth daily.    Yes [provider]  vitamin B-12 (CYANOCOBALAMIN) 1000 MCG tablet Take 1,000 mcg by mouth daily.   Yes [provider]  Vitamin D, Cholecalciferol, 1000 units TABS Take 1,000 Units by mouth daily.    Yes [provider]  calcium carbonate (TUMS - DOSED IN MG ELEMENTAL CALCIUM) 500 MG chewable tablet Chew 1 tablet by mouth as needed.     [provider]    Physical Exam Vitals:  Vitals:   11/06/18 1044  BP: 114/74   Patient's last menstrual period was 10/11/2018.  General: NAD HEENT: normocephalic, anicteric Pulmonary: No increased work of breathing Breasts: breasts appear normal, no suspicious masses, no skin or nipple changes or axillary nodes, symmetric fibrous changes in both upper outer quadrants. Neurologic: Grossly intact Psychiatric: mood appropriate, affect full  Assessment: 33 y.o. [id number] with left breast pain  Plan: Problem List Items Addressed This Visit    None    Visit Diagnoses    Breast pain, left    -  Primary   Relevant Orders   MM DIAG BREAST TOMO BILATERAL   US BREAST LTD UNI LEFT INC AXILLA   US BREAST LTD UNI RIGHT INC AXILLA     Discussed options for management of persistent left breast pain with no palpable findings of concern. Patient desires to proceed with breast imaging. Ordered ultrasound and mammogram.  Marcelyn Bruins, CNM 11/06/2018

## 2018-11-11 ENCOUNTER — Encounter: Payer: Self-pay | Admitting: Maternal Newborn

## 2018-11-11 ENCOUNTER — Other Ambulatory Visit: Payer: 59

## 2018-11-12 ENCOUNTER — Ambulatory Visit
Admission: RE | Admit: 2018-11-12 | Discharge: 2018-11-12 | Disposition: A | Discharge status: Home / Self Care | Payer: 59 | Source: Ambulatory Visit | Attending: Maternal Newborn | Admitting: Maternal Newborn

## 2018-11-12 DIAGNOSIS — N644 Mastodynia: Secondary | ICD-10-CM | POA: Insufficient documentation

## 2018-11-12 DIAGNOSIS — R922 Inconclusive mammogram: Secondary | ICD-10-CM | POA: Diagnosis not present

## 2019-09-10 ENCOUNTER — Ambulatory Visit: Payer: 59 | Attending: Internal Medicine

## 2021-05-23 ENCOUNTER — Ambulatory Visit: Payer: No Typology Code available for payment source | Admitting: Obstetrics & Gynecology

## 2021-06-09 ENCOUNTER — Other Ambulatory Visit: Payer: Self-pay

## 2021-06-09 ENCOUNTER — Other Ambulatory Visit (HOSPITAL_COMMUNITY)
Admission: RE | Admit: 2021-06-09 | Discharge: 2021-06-09 | Disposition: A | Discharge status: Home / Self Care | Payer: No Typology Code available for payment source | Source: Ambulatory Visit | Attending: Obstetrics & Gynecology | Admitting: Obstetrics & Gynecology

## 2021-06-09 ENCOUNTER — Ambulatory Visit (INDEPENDENT_AMBULATORY_CARE_PROVIDER_SITE_OTHER): Payer: No Typology Code available for payment source | Admitting: Obstetrics & Gynecology

## 2021-06-09 ENCOUNTER — Encounter: Payer: Self-pay | Admitting: Obstetrics & Gynecology

## 2021-06-09 VITALS — BP 100/70 | Ht 64.0 in | Wt 144.0 lb

## 2021-06-09 DIAGNOSIS — Z124 Encounter for screening for malignant neoplasm of cervix: Secondary | ICD-10-CM | POA: Diagnosis not present

## 2021-06-09 DIAGNOSIS — R5383 Other fatigue: Secondary | ICD-10-CM

## 2021-06-09 DIAGNOSIS — Z01419 Encounter for gynecological examination (general) (routine) without abnormal findings: Secondary | ICD-10-CM

## 2021-06-09 NOTE — Patient Instructions (Signed)
Recommendations to boost your immunity to prevent illness such as viral flu and colds, including covid19, are as follows: °      - - -  Vitamin K2 and Vitamin D3  - - - °Take Vitamin K2 at 200-300 mcg daily (usually 2-3 pills daily of the over the counter formulation). °Take Vitamin D3 at 3000-4000 U daily (usually 3-4 pills daily of the over the counter formulation). °Studies show that these two at high normal levels in your system are very effective in keeping your immunity so strong and protective that you will be unlikely to contract viral illness such as those listed above. ° °Dr Ronetta Molla ° °

## 2021-06-09 NOTE — Progress Notes (Signed)
HPI:      [name] is a 35 y.o. [phone] who LMP was Patient's last menstrual period was 05/27/2021., she presents today for her annual examination. The patient has no complaints today. The patient is sexually active. Her last pap: was normal. The patient does perform self breast exams.  There is no notable family history of breast or ovarian cancer in her family.  The patient has regular exercise: yes.  The patient denies current symptoms of depression.    GYN History: Contraception: tubal ligation  PMHx: Past Medical History:  Diagnosis Date   Dysfunctional uterine bleeding    Headache    Past Surgical History:  Procedure Laterality Date   CESAREAN SECTION  09/11/10, 03/10/2013   CESAREAN SECTION WITH BILATERAL TUBAL LIGATION Bilateral 03/28/2017   Procedure: CESAREAN SECTION WITH BILATERAL TUBAL LIGATION;  Surgeon: Nadara Mustard, MD;  Location: [facility] ORS;  Service: Obstetrics;  Laterality: Bilateral;   URETHRAL DIVERTICULUM REPAIR  01/2016   Family History  Problem Relation Age of Onset   Cervical cancer Mother 56   Cancer Maternal Grandmother    Lung cancer Maternal Grandfather    Diabetes Paternal Grandfather    Breast cancer Neg Hx    Social History   Tobacco Use   Smoking status: Former    Packs/day: 1.00    Types: Cigarettes    Quit date: 12/10/2007    Years since quitting: 13.5   Smokeless tobacco: Never  Vaping Use   Vaping Use: Never used  Substance Use Topics   Alcohol use: No    Alcohol/week: 8.0 standard drinks    Types: 8 Glasses of wine per week    Comment: weekly   Drug use: No    Current Outpatient Medications:    calcium carbonate (TUMS - DOSED IN MG ELEMENTAL CALCIUM) 500 MG chewable tablet, Chew 1 tablet by mouth as needed. , Disp: , Rfl:    cyanocobalamin 1000 MCG tablet, Take 1,000 mcg by mouth daily., Disp: , Rfl:    magnesium oxide (MAG-OX) 400 MG tablet, Take 400 mg by mouth daily. , Disp: , Rfl:    Multiple Vitamin (MULTI-VITAMINS)  TABS, Take 1 tablet by mouth daily. , Disp: , Rfl:    vitamin B-12 (CYANOCOBALAMIN) 1000 MCG tablet, Take 1,000 mcg by mouth daily., Disp: , Rfl:    Vitamin D, Cholecalciferol, 1000 units TABS, Take 1,000 Units by mouth daily. , Disp: , Rfl:  Allergies: Patient has no known allergies.  Review of Systems  Constitutional:  Negative for chills, fever and malaise/fatigue.  HENT:  Negative for congestion, sinus pain and sore throat.   Eyes:  Negative for blurred vision and pain.  Respiratory:  Negative for cough and wheezing.   Cardiovascular:  Negative for chest pain and leg swelling.  Gastrointestinal:  Negative for abdominal pain, constipation, diarrhea, heartburn, nausea and vomiting.  Genitourinary:  Negative for dysuria, frequency, hematuria and urgency.  Musculoskeletal:  Negative for back pain, joint pain, myalgias and neck pain.  Skin:  Negative for itching and rash.  Neurological:  Negative for dizziness, tremors and weakness.  Endo/Heme/Allergies:  Does not bruise/bleed easily.  Psychiatric/Behavioral:  Negative for depression. The patient is not nervous/anxious and does not have insomnia.    Objective: BP 100/70   Ht 5\' 4"  (1.626 m)   Wt 144 lb (65.3 kg)   LMP 05/27/2021   BMI 24.72 kg/m   Filed Weights   06/09/21 1327  Weight: 144 lb (65.3 kg)  Body mass index is 24.72 kg/m. Physical Exam Constitutional:      General: She is not in acute distress.    Appearance: She is well-developed.  Genitourinary:     Bladder, rectum and urethral meatus normal.     No lesions in the vagina.     Right Labia: No rash, tenderness or lesions.    Left Labia: No tenderness, lesions or rash.    No vaginal bleeding.      Right Adnexa: not tender and no mass present.    Left Adnexa: not tender and no mass present.    No cervical motion tenderness, friability, lesion or polyp.     Uterus is not enlarged.     No uterine mass detected.    Pelvic exam was performed with patient in the  lithotomy position.  Breasts:    Right: No mass, skin change or tenderness.     Left: No mass, skin change or tenderness.  HENT:     Head: Normocephalic and atraumatic. No laceration.     Right Ear: Hearing normal.     Left Ear: Hearing normal.     Mouth/Throat:     Pharynx: Uvula midline.  Eyes:     Pupils: Pupils are equal, round, and reactive to light.  Neck:     Thyroid: No thyromegaly.  Cardiovascular:     Rate and Rhythm: Normal rate and regular rhythm.     Heart sounds: No murmur heard.   No friction rub. No gallop.  Pulmonary:     Effort: Pulmonary effort is normal. No respiratory distress.     Breath sounds: Normal breath sounds. No wheezing.  Abdominal:     General: Bowel sounds are normal. There is no distension.     Palpations: Abdomen is soft.     Tenderness: There is no abdominal tenderness. There is no rebound.  Musculoskeletal:        General: Normal range of motion.     Cervical back: Normal range of motion and neck supple.  Neurological:     Mental Status: She is alert and oriented to person, place, and time.     Cranial Nerves: No cranial nerve deficit.  Skin:    General: Skin is warm and dry.  Psychiatric:        Judgment: Judgment normal.  Vitals reviewed.    Assessment:  ANNUAL EXAM 1. Women's annual routine gynecological examination   2. Fatigue, unspecified type   3. Screening for cervical cancer      Screening Plan:            1.  Cervical Screening-  Pap smear done today  2. Breast screening- Exam annually and mammogram>40 planned   3. Fatigue, unspecified type Exercise, diet, stress all discussed as factors Will also check for deficiencies: - TSH - CBC With Differential - Vitamin B12  4. Counseling for contraception: bilateral tubal ligation The pregnancy intention screening data noted above was reviewed. Potential methods of contraception were discussed. The patient elected to proceed with Female Sterilization.       F/U  Return in about 1 year (around 06/09/2022) for Annual.  Annamarie Major, MD, [facility], [facility] 06/09/2021  2:25 PM

## 2021-06-10 LAB — CBC WITH DIFFERENTIAL
Basophils Absolute: 0 10*3/uL (ref 0.0–0.2)
Basos: 0 %
EOS (ABSOLUTE): 0 10*3/uL (ref 0.0–0.4)
Eos: 0 %
Hematocrit: 42.1 % (ref 34.0–46.6)
Hemoglobin: 14.2 g/dL (ref 11.1–15.9)
Immature Grans (Abs): 0 10*3/uL (ref 0.0–0.1)
Immature Granulocytes: 0 %
Lymphocytes Absolute: 2.4 10*3/uL (ref 0.7–3.1)
Lymphs: 32 %
MCH: 29.6 pg (ref 26.6–33.0)
MCHC: 33.7 g/dL (ref 31.5–35.7)
MCV: 88 fL (ref 79–97)
Monocytes Absolute: 0.5 10*3/uL (ref 0.1–0.9)
Monocytes: 6 %
Neutrophils Absolute: 4.7 10*3/uL (ref 1.4–7.0)
Neutrophils: 62 %
RBC: 4.8 x10E6/uL (ref 3.77–5.28)
RDW: 12.4 % (ref 11.7–15.4)
WBC: 7.5 10*3/uL (ref 3.4–10.8)

## 2021-06-10 LAB — TSH: TSH: 1.52 u[IU]/mL (ref 0.450–4.500)

## 2021-06-10 LAB — VITAMIN B12: Vitamin B-12: 334 pg/mL (ref 232–1245)

## 2021-06-13 LAB — CYTOLOGY - PAP
Comment: NEGATIVE
Diagnosis: NEGATIVE
High risk HPV: NEGATIVE

## 2021-07-26 ENCOUNTER — Other Ambulatory Visit: Payer: Self-pay | Admitting: Gastroenterology

## 2021-07-26 ENCOUNTER — Other Ambulatory Visit (HOSPITAL_COMMUNITY): Payer: Self-pay | Admitting: Gastroenterology

## 2021-07-26 DIAGNOSIS — R1013 Epigastric pain: Secondary | ICD-10-CM

## 2021-07-26 DIAGNOSIS — R1011 Right upper quadrant pain: Secondary | ICD-10-CM

## 2021-07-31 ENCOUNTER — Other Ambulatory Visit: Payer: Self-pay

## 2021-07-31 ENCOUNTER — Ambulatory Visit
Admission: RE | Admit: 2021-07-31 | Discharge: 2021-07-31 | Disposition: A | Discharge status: Home / Self Care | Payer: No Typology Code available for payment source | Source: Ambulatory Visit | Attending: Gastroenterology | Admitting: Gastroenterology

## 2021-07-31 DIAGNOSIS — R1013 Epigastric pain: Secondary | ICD-10-CM

## 2021-07-31 DIAGNOSIS — R1011 Right upper quadrant pain: Secondary | ICD-10-CM

## 2021-08-18 ENCOUNTER — Other Ambulatory Visit: Payer: Self-pay | Admitting: Internal Medicine

## 2021-08-18 ENCOUNTER — Other Ambulatory Visit (HOSPITAL_COMMUNITY): Payer: Self-pay | Admitting: Internal Medicine

## 2021-08-18 DIAGNOSIS — R1013 Epigastric pain: Secondary | ICD-10-CM

## 2021-08-21 ENCOUNTER — Other Ambulatory Visit: Payer: Self-pay

## 2021-08-21 ENCOUNTER — Ambulatory Visit
Admission: RE | Admit: 2021-08-21 | Discharge: 2021-08-21 | Disposition: A | Discharge status: Home / Self Care | Payer: No Typology Code available for payment source | Source: Ambulatory Visit | Attending: Internal Medicine | Admitting: Internal Medicine

## 2021-08-21 DIAGNOSIS — R1013 Epigastric pain: Secondary | ICD-10-CM

## 2021-08-21 MED ORDER — IOHEXOL 300 MG/ML  SOLN
100.0000 mL | Freq: Once | INTRAMUSCULAR | Status: AC | PRN
  Administered 2021-08-21: 100 mL via INTRAVENOUS

## 2021-08-23 ENCOUNTER — Other Ambulatory Visit: Payer: Self-pay | Admitting: Gastroenterology

## 2021-08-23 DIAGNOSIS — R112 Nausea with vomiting, unspecified: Secondary | ICD-10-CM

## 2021-09-01 ENCOUNTER — Other Ambulatory Visit: Payer: Self-pay

## 2021-09-01 ENCOUNTER — Encounter
Admission: RE | Admit: 2021-09-01 | Discharge: 2021-09-01 | Disposition: A | Discharge status: Home / Self Care | Payer: No Typology Code available for payment source | Source: Ambulatory Visit | Attending: Gastroenterology | Admitting: Gastroenterology

## 2021-09-01 DIAGNOSIS — R112 Nausea with vomiting, unspecified: Secondary | ICD-10-CM | POA: Diagnosis not present

## 2021-09-01 MED ORDER — TECHNETIUM TC 99M MEBROFENIN IV KIT
5.0000 | PACK | Freq: Once | INTRAVENOUS | Status: AC | PRN
  Administered 2021-09-01: 09:00:00 5.3 via INTRAVENOUS

## 2022-06-21 ENCOUNTER — Ambulatory Visit: Payer: Self-pay | Admitting: Family Medicine

## 2023-07-04 IMAGING — NM NM HEPATO W/GB/PHARM/[PERSON_NAME]
2 series · 12 of 12 positions shown · non-contrast
Comparison: CT from 08/21/2021

CLINICAL DATA: Right upper quadrant pain for several months

EXAM:
NUCLEAR MEDICINE HEPATOBILIARY IMAGING WITH GALLBLADDER EF
TECHNIQUE: Sequential images of the abdomen were obtained [DATE] minutes
following intravenous administration of radiopharmaceutical. After
oral ingestion of Ensure, gallbladder ejection fraction was
determined. At 60 min, normal ejection fraction is greater than 33%.
RADIOPHARMACEUTICALS:  5.3 mCi Ec-11m  Choletec IV

[Series 1000: hepatobiliary scan · 9.59mm/px · 6 of 60 frames shown]
[frame 6/60]
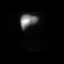
[frame 16/60]
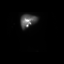
[frame 26/60]
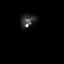
[frame 36/60]
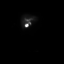
[frame 46/60]
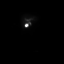
[frame 56/60]
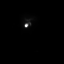

[Series 1000: gallbladder ef · 4.80mm/px · 6 of 120 frames shown]
[frame 11/120]
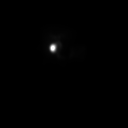
[frame 31/120]
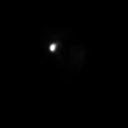
[frame 51/120]
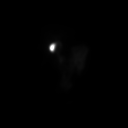
[frame 71/120]
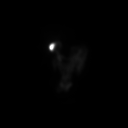
[frame 91/120]
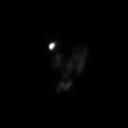
[frame 111/120]
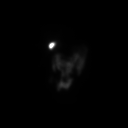

[12 of 12 positions shown; findings below may reference images not displayed]

FINDINGS: Prompt uptake and biliary excretion of activity by the liver is
seen. Gallbladder activity is visualized, consistent with patency of
cystic duct. Biliary activity passes into small bowel, consistent
with patent common bile duct.

Calculated gallbladder ejection fraction is 63%. (Normal gallbladder
ejection fraction with Ensure is greater than 33%.)
IMPRESSION: Normal uptake and excretion of biliary tracer.

Normal gallbladder ejection fraction of 63%

## 2023-08-14 ENCOUNTER — Encounter: Payer: Self-pay | Admitting: Family

## 2023-08-14 ENCOUNTER — Other Ambulatory Visit: Payer: Self-pay | Admitting: Family

## 2023-08-14 DIAGNOSIS — R1013 Epigastric pain: Secondary | ICD-10-CM

## 2023-08-14 DIAGNOSIS — R1011 Right upper quadrant pain: Secondary | ICD-10-CM

## 2023-08-15 ENCOUNTER — Ambulatory Visit
Admission: RE | Admit: 2023-08-15 | Discharge: 2023-08-15 | Disposition: A | Discharge status: Home / Self Care | Payer: No Typology Code available for payment source | Source: Ambulatory Visit | Attending: Family | Admitting: Family

## 2023-08-15 DIAGNOSIS — R1011 Right upper quadrant pain: Secondary | ICD-10-CM

## 2023-08-15 DIAGNOSIS — R1013 Epigastric pain: Secondary | ICD-10-CM

## 2023-08-16 ENCOUNTER — Other Ambulatory Visit: Payer: Self-pay | Admitting: Family

## 2023-08-16 DIAGNOSIS — R1011 Right upper quadrant pain: Secondary | ICD-10-CM

## 2023-08-16 DIAGNOSIS — R1013 Epigastric pain: Secondary | ICD-10-CM

## 2023-08-19 ENCOUNTER — Encounter: Payer: Self-pay | Admitting: Family

## 2023-08-21 ENCOUNTER — Inpatient Hospital Stay
Admission: RE | Admit: 2023-08-21 | Discharge: 2023-08-21 | Discharge status: Home / Self Care | Payer: No Typology Code available for payment source | Source: Ambulatory Visit | Attending: Family

## 2023-08-21 ENCOUNTER — Other Ambulatory Visit: Payer: No Typology Code available for payment source

## 2023-08-21 DIAGNOSIS — R1011 Right upper quadrant pain: Secondary | ICD-10-CM

## 2023-08-21 DIAGNOSIS — R1013 Epigastric pain: Secondary | ICD-10-CM

## 2024-05-19 ENCOUNTER — Other Ambulatory Visit: Payer: Self-pay | Admitting: Family

## 2024-05-19 DIAGNOSIS — N6452 Nipple discharge: Secondary | ICD-10-CM

## 2024-05-19 DIAGNOSIS — N644 Mastodynia: Secondary | ICD-10-CM

## 2024-05-21 ENCOUNTER — Ambulatory Visit
Admission: RE | Admit: 2024-05-21 | Discharge: 2024-05-21 | Disposition: A | Discharge status: Home / Self Care | Source: Ambulatory Visit | Attending: Family | Admitting: Family

## 2024-05-21 DIAGNOSIS — N6452 Nipple discharge: Secondary | ICD-10-CM

## 2024-05-21 DIAGNOSIS — N644 Mastodynia: Secondary | ICD-10-CM | POA: Diagnosis present

## 2024-06-16 ENCOUNTER — Other Ambulatory Visit (INDEPENDENT_AMBULATORY_CARE_PROVIDER_SITE_OTHER): Payer: Self-pay | Admitting: Nurse Practitioner

## 2024-06-16 DIAGNOSIS — I776 Arteritis, unspecified: Secondary | ICD-10-CM

## 2024-06-16 DIAGNOSIS — L819 Disorder of pigmentation, unspecified: Secondary | ICD-10-CM

## 2024-06-17 ENCOUNTER — Ambulatory Visit (INDEPENDENT_AMBULATORY_CARE_PROVIDER_SITE_OTHER): Admitting: Nurse Practitioner

## 2024-06-17 ENCOUNTER — Encounter (INDEPENDENT_AMBULATORY_CARE_PROVIDER_SITE_OTHER): Payer: Self-pay | Admitting: Nurse Practitioner

## 2024-06-17 ENCOUNTER — Ambulatory Visit (INDEPENDENT_AMBULATORY_CARE_PROVIDER_SITE_OTHER)

## 2024-06-17 VITALS — BP 109/75 | HR 80 | Resp 18 | Ht 64.0 in | Wt 145.6 lb

## 2024-06-17 DIAGNOSIS — I776 Arteritis, unspecified: Secondary | ICD-10-CM

## 2024-06-17 DIAGNOSIS — I8311 Varicose veins of right lower extremity with inflammation: Secondary | ICD-10-CM

## 2024-06-17 DIAGNOSIS — L819 Disorder of pigmentation, unspecified: Secondary | ICD-10-CM

## 2024-06-17 DIAGNOSIS — I8312 Varicose veins of left lower extremity with inflammation: Secondary | ICD-10-CM | POA: Diagnosis not present

## 2024-06-17 NOTE — Progress Notes (Signed)
 Subjective:    Patient ID: Sharon Weaver, female    DOB: 1986-04-30, 38 y.o.   MRN: [id number] Chief Complaint  Patient presents with   Establish Care    New patient ABI + consult vasculitlis + foot discoloration w/ visible veins. RefSABRA devonshire    The patient is a 38 year old female referred by Orie devonshire, NP in regards to concern for dark discoloration of her feet.  She notes that this has been ongoing for several years.  She works a very sedentary job and notes it tends to be worse after she has been sitting for longer.  And actually is better when she gets up and walks.  She does have multiple notable varicosities noted bilaterally.  She notes that sometimes at the end of the day her legs may feel hot heavy and tired and achy.  She also has some areas in her thigh that sometimes hurt and have a aching sensation.  She does endorse having some feeling of tightness and swelling at times as well.  She denies any open wounds or ulcerations.  Today she underwent ABIs due to concern for possible arterial disease as a cause of her discoloration.  She has a right ABI 1.28 on the left 1.21.  She has strong triphasic tibial artery waveforms with normal toe waveforms bilaterally.  Her TBI's are normal bilaterally as well.    Review of Systems  Cardiovascular:  Positive for leg swelling.  All other systems reviewed and are negative.      Objective:   Physical Exam Vitals reviewed.  HENT:     Head: Normocephalic.  Cardiovascular:     Rate and Rhythm: Normal rate.     Pulses: Normal pulses.  Pulmonary:     Effort: Pulmonary effort is normal.  Musculoskeletal:     Right lower leg: No edema.     Left lower leg: No edema.  Neurological:     Mental Status: She is alert and oriented to person, place, and time.  Psychiatric:        Mood and Affect: Mood normal.        Behavior: Behavior normal.        Thought Content: Thought content normal.        Judgment: Judgment normal.     BP 109/75 (BP  Location: Left Arm, Patient Position: Sitting, Cuff Size: Normal)   Pulse 80   Resp 18   Ht 5' 4 (1.626 m)   Wt 145 lb 9.6 oz (66 kg)   LMP 05/21/2024   BMI 24.99 kg/m   Past Medical History:  Diagnosis Date   Dysfunctional uterine bleeding    Headache     Social History   Socioeconomic History   Marital status: Married    Spouse name: Not on file   Number of children: Not on file   Years of education: Not on file   Highest education level: Not on file  Occupational History   Occupation: Advertising account planner  Tobacco Use   Smoking status: Former    Current packs/day: 0.00    Types: Cigarettes    Quit date: 12/10/2007    Years since quitting: 16.5   Smokeless tobacco: Never  Vaping Use   Vaping status: Never Used  Substance and Sexual Activity   Alcohol use: No    Alcohol/week: 8.0 standard drinks of alcohol    Types: 8 Glasses of wine per week    Comment: weekly   Drug use: No   Sexual activity:  Yes    Birth control/protection: Surgical  Other Topics Concern   Not on file  Social History Narrative   Not on file   Social Drivers of Health   Financial Resource Strain: Not on file  Food Insecurity: Not on file  Transportation Needs: Not on file  Physical Activity: Not on file  Stress: Not on file  Social Connections: Not on file  Intimate Partner Violence: Not on file    Past Surgical History:  Procedure Laterality Date   CESAREAN SECTION  09/11/10, 03/10/2013   CESAREAN SECTION WITH BILATERAL TUBAL LIGATION Bilateral 03/28/2017   Procedure: CESAREAN SECTION WITH BILATERAL TUBAL LIGATION;  Surgeon: Arloa Lamar SQUIBB, MD;  Location: [facility] ORS;  Service: Obstetrics;  Laterality: Bilateral;   URETHRAL DIVERTICULUM REPAIR  01/2016    Family History  Problem Relation Age of Onset   Cervical cancer Mother 77   Cancer Maternal Grandmother    Lung cancer Maternal Grandfather    Diabetes Paternal Grandfather    Breast cancer Neg Hx     No Known Allergies      Latest Ref Rng & Units 06/09/2021    2:38 PM 07/28/2017    9:11 PM 03/29/2017    6:31 AM  CBC  WBC 3.4 - 10.8 x10E3/uL 7.5  7.6  10.5   Hemoglobin 11.1 - 15.9 g/dL 85.7  86.9  89.0   Hematocrit 34.0 - 46.6 % 42.1  37.8  31.4   Platelets 150 - 440 K/uL  188  115       CMP     Component Value Date/Time   NA 133 (L) 07/28/2017 2111   NA 143 01/02/2016 1440   K 3.6 07/28/2017 2111   CL 101 07/28/2017 2111   CO2 27 07/28/2017 2111   GLUCOSE 103 (H) 07/28/2017 2111   BUN 16 07/28/2017 2111   BUN 5 (L) 01/02/2016 1440   CREATININE 0.75 07/28/2017 2111   CALCIUM 9.3 07/28/2017 2111   GFRNONAA >60 07/28/2017 2111     No results found.     Assessment & Plan:   1. 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  I will have the patient return at her convenience to do noninvasive studies.  We also discussed about using conservative therapy including use of medical grade compression socks, elevation and activity  2. Vasculitis Based on upon the patient's presentation I do not think she has vasculitis as she does not have many of the other symptoms that typically occur with it.  I also discussed with the patient that vasculitis is typically autoimmune disorder and this is actually something we do not evaluate for.  If the patient has history of evaluating for vasculitis we will refer to rheumatology.   Current Outpatient Medications on File Prior to Visit  Medication Sig Dispense Refill   cyanocobalamin 1000 MCG tablet Take 1,000 mcg by mouth daily.     magnesium oxide (MAG-OX) 400 MG tablet Take 400 mg by mouth daily.      Multiple Vitamin (MULTI-VITAMINS) TABS Take 1 tablet by mouth daily.      vitamin B-12 (CYANOCOBALAMIN) 1000 MCG tablet Take 1,000 mcg by  mouth daily.     Vitamin D , Cholecalciferol, 1000 units TABS Take 1,000 Units by mouth daily.      calcium  carbonate (TUMS - DOSED IN MG ELEMENTAL CALCIUM) 500 MG chewable tablet Chew 1 tablet by mouth as needed.      No current facility-administered medications on file prior to visit.    There are no Patient Instructions on file for this visit. No follow-ups on file.   Eulas Schweitzer E Omaya Nieland, NP

## 2024-06-23 LAB — VAS US ABI WITH/WO TBI
Left ABI: 1.21
Right ABI: 1.28

## 2024-07-17 ENCOUNTER — Encounter (INDEPENDENT_AMBULATORY_CARE_PROVIDER_SITE_OTHER)

## 2024-07-17 ENCOUNTER — Ambulatory Visit (INDEPENDENT_AMBULATORY_CARE_PROVIDER_SITE_OTHER): Admitting: Nurse Practitioner

## 2024-08-04 ENCOUNTER — Other Ambulatory Visit: Payer: Self-pay | Admitting: Gastroenterology

## 2024-08-04 DIAGNOSIS — R935 Abnormal findings on diagnostic imaging of other abdominal regions, including retroperitoneum: Secondary | ICD-10-CM

## 2024-08-05 ENCOUNTER — Other Ambulatory Visit (INDEPENDENT_AMBULATORY_CARE_PROVIDER_SITE_OTHER): Payer: Self-pay | Admitting: Nurse Practitioner

## 2024-08-05 DIAGNOSIS — I8312 Varicose veins of left lower extremity with inflammation: Secondary | ICD-10-CM

## 2024-08-10 ENCOUNTER — Ambulatory Visit

## 2024-08-14 ENCOUNTER — Ambulatory Visit (INDEPENDENT_AMBULATORY_CARE_PROVIDER_SITE_OTHER): Admitting: Nurse Practitioner

## 2024-08-14 ENCOUNTER — Encounter (INDEPENDENT_AMBULATORY_CARE_PROVIDER_SITE_OTHER)

## 2024-08-18 ENCOUNTER — Ambulatory Visit
Admission: RE | Admit: 2024-08-18 | Discharge: 2024-08-18 | Disposition: A | Source: Ambulatory Visit | Attending: Gastroenterology

## 2024-08-18 DIAGNOSIS — R935 Abnormal findings on diagnostic imaging of other abdominal regions, including retroperitoneum: Secondary | ICD-10-CM

## 2024-08-27 ENCOUNTER — Encounter (INDEPENDENT_AMBULATORY_CARE_PROVIDER_SITE_OTHER): Payer: Self-pay | Admitting: Nurse Practitioner

## 2024-08-27 ENCOUNTER — Other Ambulatory Visit (INDEPENDENT_AMBULATORY_CARE_PROVIDER_SITE_OTHER)

## 2024-08-27 ENCOUNTER — Ambulatory Visit (INDEPENDENT_AMBULATORY_CARE_PROVIDER_SITE_OTHER): Admitting: Nurse Practitioner

## 2024-08-27 VITALS — BP 121/85 | HR 60 | Resp 18 | Wt 142.0 lb

## 2024-08-27 DIAGNOSIS — I8311 Varicose veins of right lower extremity with inflammation: Secondary | ICD-10-CM

## 2024-08-27 DIAGNOSIS — I8312 Varicose veins of left lower extremity with inflammation: Secondary | ICD-10-CM | POA: Diagnosis not present

## 2024-08-27 NOTE — Progress Notes (Signed)
 Subjective:    Patient ID: Sharon Weaver, female    DOB: 1986-08-29, 38 y.o.   MRN: [id number] Chief Complaint  Patient presents with   Follow-up    Pt conv bilateral venous reflux    The patient returns today for follow-up evaluation of discoloration of the legs felt to be from venous insufficiency.  She notes that there are areas near her ankles that change colors especially as she is standing throughout the day or with sometimes temperature changes.  She has utilized medical grade compression, elevation and activity to help with these changes.  She also endorses having varicosities and having had placed before previously due to these varicosities.  Currently she denies any open wounds or ulcerations.  Today she underwent noninvasive studies which showed no evidence of DVT or superficial thrombophlebitis bilaterally.  No evidence of deep venous insufficiency or superficial venous reflux noted bilaterally.     Review of Systems  Hematological:  Bruises/bleeds easily.  All other systems reviewed and are negative.      Objective:   Physical Exam Vitals reviewed.  HENT:     Head: Normocephalic.  Cardiovascular:     Rate and Rhythm: Normal rate.     Pulses: Normal pulses.  Pulmonary:     Effort: Pulmonary effort is normal.  Musculoskeletal:        General: Tenderness present.  Skin:    General: Skin is warm and dry.     Findings: Bruising present.  Neurological:     Mental Status: She is alert and oriented to person, place, and time.  Psychiatric:        Mood and Affect: Mood normal.        Behavior: Behavior normal.        Thought Content: Thought content normal.        Judgment: Judgment normal.     Physical Exam   BP 121/85 (BP Location: Right Arm)   Pulse 60   Resp 18   Wt 142 lb (64.4 kg)   BMI 24.37 kg/m   Past Medical History:  Diagnosis Date   Dysfunctional uterine bleeding    Headache     Social History   Socioeconomic History   Marital status:  Married    Spouse name: Not on file   Number of children: Not on file   Years of education: Not on file   Highest education level: Not on file  Occupational History   Occupation: Advertising Account Planner  Tobacco Use   Smoking status: Former    Current packs/day: 0.00    Average packs/day: 1.0 packs/day    Types: Cigarettes    Quit date: 12/10/2007    Years since quitting: 16.7   Smokeless tobacco: Never  Vaping Use   Vaping status: Never Used  Substance and Sexual Activity   Alcohol use: No    Alcohol/week: 8.0 standard drinks of alcohol    Types: 8 Glasses of wine per week    Comment: weekly   Drug use: No   Sexual activity: Yes    Birth control/protection: Surgical  Other Topics Concern   Not on file  Social History Narrative   Not on file   Social Drivers of Health   Tobacco Use: Medium Risk (08/27/2024)   Patient History    Smoking Tobacco Use: Former    Smokeless Tobacco Use: Never    Passive Exposure: Not on file  Financial Resource Strain: Low Risk  (07/21/2024)   Received from [facility]  Overall Financial Resource Strain (CARDIA)    Difficulty of Paying Living Expenses: Not hard at all  Food Insecurity: No Food Insecurity (07/21/2024)   Received from [facility]   Epic    Within the past 12 months, you worried that your food would run out before you got the money to buy more.: Never true    Within the past 12 months, the food you bought just didn't last and you didn't have money to get more.: Never true  Transportation Needs: No Transportation Needs (07/21/2024)   Received from [facility] - Transportation    In the past 12 months, has lack of transportation kept you from medical appointments or from getting medications?: No    Lack of Transportation (Non-Medical): No  Physical Activity: Not on file  Stress: Not on file  Social Connections: Not on file  Intimate Partner Violence: Not on file   Depression (EYV7-0): Not on file  Alcohol Screen: Not on file  Housing: Unknown (07/21/2024)   Received from [facility]   Epic    In the last 12 months, was there a time when you were not able to pay the mortgage or rent on time?: No    Number of Times Moved in the Last Year: Not on file    At any time in the past 12 months, were you homeless or living in a shelter (including now)?: No  Utilities: Not At Risk (07/21/2024)   Received from [facility]   Epic    In the past 12 months has the electric, gas, oil, or water company threatened to shut off services in your home?: No  Health Literacy: Not on file    Past Surgical History:  Procedure Laterality Date   CESAREAN SECTION  09/11/10, 03/10/2013   CESAREAN SECTION WITH BILATERAL TUBAL LIGATION Bilateral 03/28/2017   Procedure: CESAREAN SECTION WITH BILATERAL TUBAL LIGATION;  Surgeon: Arloa Lamar SQUIBB, MD;  Location: [facility] ORS;  Service: Obstetrics;  Laterality: Bilateral;   URETHRAL DIVERTICULUM REPAIR  01/2016    Family History  Problem Relation Age of Onset   Cervical cancer Mother 42   Cancer Maternal Grandmother    Lung cancer Maternal Grandfather    Diabetes Paternal Grandfather    Breast cancer Neg Hx     Allergies[1]     Latest Ref Rng & Units 06/09/2021    2:38 PM 07/28/2017    9:11 PM 03/29/2017    6:31 AM  CBC  WBC 3.4 - 10.8 x10E3/uL 7.5  7.6  10.5   Hemoglobin 11.1 - 15.9 g/dL 85.7  86.9  89.0   Hematocrit 34.0 - 46.6 % 42.1  37.8  31.4   Platelets 150 - 440 K/uL  188  115       CMP     Component Value Date/Time   NA 133 (L) 07/28/2017 2111   NA 143 01/02/2016 1440   K 3.6 07/28/2017 2111   CL 101 07/28/2017 2111   CO2 27 07/28/2017 2111   GLUCOSE 103 (H) 07/28/2017 2111   BUN 16 07/28/2017 2111   BUN 5 (L) 01/02/2016 1440   CREATININE 0.75 07/28/2017 2111   CALCIUM 9.3 07/28/2017 2111   GFRNONAA >60 07/28/2017 2111     No results found.     Assessment &  Plan:   1. 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  We discussed the options of continuing with conservative therapy including use of medical grade compression, elevation and activity versus sclerotherapy to help treat the varicosities.  We discussed the procedure itself as well as the benefits, risk and alternatives.  The patient wishes to consider this and will contact us  if she wishes to move forward.   Medications Ordered Prior to Encounter[2]  There are no Patient Instructions on file for this visit. No follow-ups on file.   Veretta Sabourin E Hanif Radin, NP      [1] No Known Allergies [2]  Current Outpatient Medications on File Prior to Visit  Medication Sig Dispense Refill   calcium carbonate (TUMS - DOSED IN MG ELEMENTAL CALCIUM) 500 MG chewable tablet Chew 1 tablet by mouth as needed.      cyanocobalamin 1000 MCG tablet Take 1,000 mcg by mouth daily.     magnesium oxide (MAG-OX) 400 MG tablet Take 400 mg by mouth daily.      Multiple Vitamin (MULTI-VITAMINS) TABS Take 1 tablet by mouth daily.      vitamin B-12 (CYANOCOBALAMIN) 1000 MCG tablet Take 1,000 mcg by mouth daily.     Vitamin D , Cholecalciferol, 1000 units TABS Take 1,000 Units by mouth daily.      No current facility-administered medications on file prior to visit.

## 2024-09-22 NOTE — Progress Notes (Signed)
 Subjective:   CC: Epigastric pain [R10.13]  HPI: Returns for evaluation of above CC.   History of Present Illness Sharon Weaver is a 39 year old female with intestinal metaplasia of the stomach who presents for evaluation of chronic epigastric and right upper quadrant abdominal pain.  She has had intermittent right upper quadrant pain radiating to the upper back for about three years, with more frequent flares since October 2025. Pain is associated with episodes of diarrhea, and the current flare began this past Thursday and has persisted.  Upper endoscopy on July 21, 2024 showed intestinal metaplasia of the stomach. Abdominal CT in 08/21/23 was normal. She denies prior gallstones, dysuria, or hematuria. She drinks at least six alcoholic beverages on weekends.   Past Medical History:  has a past medical history of Arrhythmia and AVNRT (AV nodal re-entry tachycardia) ([facility]-HCC).  Past Surgical History:  has a past surgical history that includes Cesarean section; Excision Urethral Diverticulum; egd (08/18/2021); and EGD @ [facility] (08/10/2024).  Family History: family history includes Asthma in her paternal grandmother; Cancer in her maternal grandfather and mother; High blood pressure (Hypertension) in her paternal grandfather; Myocardial Infarction (Heart attack) in her maternal grandfather and paternal grandfather; Stroke in her paternal grandfather.  Social History:  reports that she quit smoking about 16 years ago. Her smoking use included cigarettes. She started smoking about 21 years ago. She has a 5 pack-year smoking history. She has never used smokeless tobacco. She reports current alcohol use. She reports that she does not use drugs.  Current Medications: has a current medication list which includes the following prescription(s): cholecalciferol, cyanocobalamin, cyanocobalamin/mecobalamin, hyoscyamine, and vitamin d3-vitamin k2.  Allergies:  Allergies as of 09/22/2024   (No  Known Allergies)    ROS:  A 15 point review of systems was performed and pertinent positives and negatives noted in HPI    Objective:     BP 123/87   Pulse 82   Ht 162.6 cm (5' 4)   Wt 64.9 kg (143 lb)   BMI 24.55 kg/m    Constitutional :  No distress, cooperative, alert  Lymphatics/Throat:  Supple with no lymphadenopathy  Respiratory:  Clear to auscultation bilaterally  Cardiovascular:  Regular rate and rhythm  Gastrointestinal: Soft, non-tender, non-distended, no organomegaly.  Musculoskeletal: Steady gait and movement  Skin: Cool and moist  Psychiatric: Normal affect, non-agitated, not confused       LABS:  N/a   RADS: N/a Assessment:      Epigastric pain [R10.13]- persistent now despite GI workup.  Will proceed with cholecystectomy at this time, understanding no guarantee of symptom relief.  Plan:     1. Epigastric pain [R10.13] Discussed the risk of surgery including post-op infxn, seroma, biloma, chronic pain, poor-delayed wound healing, retained gallstone, conversion to open procedure, post-op SBO or ileus, and need for additional procedures to address said risks.  The risks of general anesthetic including MI, CVA, sudden death or even reaction to anesthetic medications also discussed. Alternatives include continued observation.  Benefits include possible symptom relief, prevention of complications including acute cholecystitis, pancreatitis.  Typical post operative recovery of 3-5 days rest, continued pain in area and incision sites, possible loose stools up to 4-6 weeks, also discussed.  ED return precautions given for sudden increase in RUQ pain, with possible accompanying fever, nausea, and/or vomiting.  The patient understands the risks, any and all questions were answered to the patient's satisfaction.  2. Epigastric pain [R10.13]. Will proceed with robotic assisted laparoscopic cholecystectomy  [id number]  labs/images/medications/previous chart entries  reviewed personally and relevant changes/updates noted above.

## 2024-09-23 ENCOUNTER — Other Ambulatory Visit: Payer: Self-pay

## 2024-09-23 ENCOUNTER — Ambulatory Visit: Payer: Self-pay | Admitting: Surgery

## 2024-09-23 ENCOUNTER — Encounter
Admission: RE | Admit: 2024-09-23 | Discharge: 2024-09-23 | Disposition: A | Discharge status: Home / Self Care | Source: Ambulatory Visit | Attending: Surgery | Admitting: Surgery

## 2024-09-23 DIAGNOSIS — R1013 Epigastric pain: Secondary | ICD-10-CM

## 2024-09-23 DIAGNOSIS — Z01812 Encounter for preprocedural laboratory examination: Secondary | ICD-10-CM

## 2024-09-23 HISTORY — DX: Epigastric pain: R10.13

## 2024-09-23 HISTORY — DX: Nausea with vomiting, unspecified: R11.2

## 2024-09-23 NOTE — H&P (View-Only) (Signed)
 Subjective:   CC: Epigastric pain [R10.13]  HPI: Returns for evaluation of above CC.   History of Present Illness Sharon Weaver is a 39 year old female with intestinal metaplasia of the stomach who presents for evaluation of chronic epigastric and right upper quadrant abdominal pain.  She has had intermittent right upper quadrant pain radiating to the upper back for about three years, with more frequent flares since October 2025. Pain is associated with episodes of diarrhea, and the current flare began this past Thursday and has persisted.  Upper endoscopy on July 21, 2024 showed intestinal metaplasia of the stomach. Abdominal CT in 08/21/23 was normal. She denies prior gallstones, dysuria, or hematuria. She drinks at least six alcoholic beverages on weekends.   Past Medical History:  has a past medical history of Arrhythmia and AVNRT (AV nodal re-entry tachycardia) ([facility]HS-HCC).  Past Surgical History:  has a past surgical history that includes Cesarean section; Excision Urethral Diverticulum; egd (08/18/2021); and EGD @ [facility] (08/10/2024).  Family History: family history includes Asthma in her paternal grandmother; Cancer in her maternal grandfather and mother; High blood pressure (Hypertension) in her paternal grandfather; Myocardial Infarction (Heart attack) in her maternal grandfather and paternal grandfather; Stroke in her paternal grandfather.  Social History:  reports that she quit smoking about 16 years ago. Her smoking use included cigarettes. She started smoking about 21 years ago. She has a 5 pack-year smoking history. She has never used smokeless tobacco. She reports current alcohol use. She reports that she does not use drugs.  Current Medications: has a current medication list which includes the following prescription(s): cholecalciferol, cyanocobalamin, cyanocobalamin/mecobalamin, hyoscyamine, and vitamin d3-vitamin k2.  Allergies:  Allergies as of 09/22/2024   (No  Known Allergies)    ROS:  A 15 point review of systems was performed and pertinent positives and negatives noted in HPI    Objective:     BP 123/87   Pulse 82   Ht 162.6 cm (5' 4)   Wt 64.9 kg (143 lb)   BMI 24.55 kg/m    Constitutional :  No distress, cooperative, alert  Lymphatics/Throat:  Supple with no lymphadenopathy  Respiratory:  Clear to auscultation bilaterally  Cardiovascular:  Regular rate and rhythm  Gastrointestinal: Soft, non-tender, non-distended, no organomegaly.  Musculoskeletal: Steady gait and movement  Skin: Cool and moist  Psychiatric: Normal affect, non-agitated, not confused       LABS:  N/a   RADS: N/a Assessment:      Epigastric pain [R10.13]- persistent now despite GI workup.  Will proceed with cholecystectomy at this time, understanding no guarantee of symptom relief.  Plan:     1. Epigastric pain [R10.13] Discussed the risk of surgery including post-op infxn, seroma, biloma, chronic pain, poor-delayed wound healing, retained gallstone, conversion to open procedure, post-op SBO or ileus, and need for additional procedures to address said risks.  The risks of general anesthetic including MI, CVA, sudden death or even reaction to anesthetic medications also discussed. Alternatives include continued observation.  Benefits include possible symptom relief, prevention of complications including acute cholecystitis, pancreatitis.  Typical post operative recovery of 3-5 days rest, continued pain in area and incision sites, possible loose stools up to 4-6 weeks, also discussed.  ED return precautions given for sudden increase in RUQ pain, with possible accompanying fever, nausea, and/or vomiting.  The patient understands the risks, any and all questions were answered to the patient's satisfaction.  2. Epigastric pain [R10.13]. Will proceed with robotic assisted laparoscopic cholecystectomy  [id number]  labs/images/medications/previous chart entries  reviewed personally and relevant changes/updates noted above.

## 2024-09-23 NOTE — H&P (Signed)
 Subjective:   CC: Epigastric pain [R10.13]  HPI: Returns for evaluation of above CC.   History of Present Illness Sharon Weaver is a 39 year old female with intestinal metaplasia of the stomach who presents for evaluation of chronic epigastric and right upper quadrant abdominal pain.  She has had intermittent right upper quadrant pain radiating to the upper back for about three years, with more frequent flares since October 2025. Pain is associated with episodes of diarrhea, and the current flare began this past Thursday and has persisted.  Upper endoscopy on July 21, 2024 showed intestinal metaplasia of the stomach. Abdominal CT in 08/21/23 was normal. She denies prior gallstones, dysuria, or hematuria. She drinks at least six alcoholic beverages on weekends.   Past Medical History:  has a past medical history of Arrhythmia and AVNRT (AV nodal re-entry tachycardia) (HHS-HCC).  Past Surgical History:  has a past surgical history that includes Cesarean section; Excision Urethral Diverticulum; egd (08/18/2021); and EGD @ [facility] (08/10/2024).  Family History: family history includes Asthma in her paternal grandmother; Cancer in her maternal grandfather and mother; High blood pressure (Hypertension) in her paternal grandfather; Myocardial Infarction (Heart attack) in her maternal grandfather and paternal grandfather; Stroke in her paternal grandfather.  Social History:  reports that she quit smoking about 16 years ago. Her smoking use included cigarettes. She started smoking about 21 years ago. She has a 5 pack-year smoking history. She has never used smokeless tobacco. She reports current alcohol use. She reports that she does not use drugs.  Current Medications: has a current medication list which includes the following prescription(s): cholecalciferol, cyanocobalamin, cyanocobalamin/mecobalamin, hyoscyamine, and vitamin d3-vitamin k2.  Allergies:  Allergies as of 09/22/2024   (No  Known Allergies)    ROS:  A 15 point review of systems was performed and pertinent positives and negatives noted in HPI    Objective:     BP 123/87   Pulse 82   Ht 162.6 cm (5' 4)   Wt 64.9 kg (143 lb)   BMI 24.55 kg/m    Constitutional :  No distress, cooperative, alert  Lymphatics/Throat:  Supple with no lymphadenopathy  Respiratory:  Clear to auscultation bilaterally  Cardiovascular:  Regular rate and rhythm  Gastrointestinal: Soft, non-tender, non-distended, no organomegaly.  Musculoskeletal: Steady gait and movement  Skin: Cool and moist  Psychiatric: Normal affect, non-agitated, not confused       LABS:  N/a   RADS: N/a Assessment:      Epigastric pain [R10.13]- persistent now despite GI workup.  Will proceed with cholecystectomy at this time, understanding no guarantee of symptom relief.  Plan:     1. Epigastric pain [R10.13] Discussed the risk of surgery including post-op infxn, seroma, biloma, chronic pain, poor-delayed wound healing, retained gallstone, conversion to open procedure, post-op SBO or ileus, and need for additional procedures to address said risks.  The risks of general anesthetic including MI, CVA, sudden death or even reaction to anesthetic medications also discussed. Alternatives include continued observation.  Benefits include possible symptom relief, prevention of complications including acute cholecystitis, pancreatitis.  Typical post operative recovery of 3-5 days rest, continued pain in area and incision sites, possible loose stools up to 4-6 weeks, also discussed.  ED return precautions given for sudden increase in RUQ pain, with possible accompanying fever, nausea, and/or vomiting.  The patient understands the risks, any and all questions were answered to the patient's satisfaction.  2. Epigastric pain [R10.13]. Will proceed with robotic assisted laparoscopic cholecystectomy  [id number]  labs/images/medications/previous chart entries  reviewed personally and relevant changes/updates noted above.

## 2024-09-23 NOTE — Patient Instructions (Addendum)
 Your procedure is scheduled on: 09/25/24 - Friday Report to the Registration Desk on the 1st floor of the [facility]. To find out your arrival time, please call [phone] between 1PM - 3PM on: 09/24/24 - Thursday If your arrival time is 6:00 am, do not arrive before that time as the [facility] entrance doors do not open until 6:00 am.  REMEMBER: Instructions that are not followed completely may result in serious medical risk, up to and including death; or upon the discretion of your surgeon and anesthesiologist your surgery may need to be rescheduled.  Do not eat food after midnight the night before surgery.  No gum chewing or hard candies.  You may however, drink CLEAR liquids up to 2 hours before you are scheduled to arrive for your surgery. Do not drink anything within 2 hours of your scheduled arrival time.  Clear liquids include: - water  - apple juice without pulp - gatorade (not RED colors) - black coffee or tea (Do NOT add milk or creamers to the coffee or tea) Do NOT drink anything that is not on this list.  One week prior to surgery: Stop Anti-inflammatories (NSAIDS) such as Advil , Aleve, Ibuprofen , Motrin , Naproxen, Naprosyn and Aspirin based products such as Excedrin, Goody's Powder, BC Powder. You may take Tylenol  if needed for pain up until the day of surgery.  Stop ANY OVER THE COUNTER supplements until after surgery.  ON THE DAY OF SURGERY ONLY TAKE THESE MEDICATIONS WITH SIPS OF WATER:  none   No Alcohol for 24 hours before or after surgery.  No Smoking including e-cigarettes for 24 hours before surgery.  No chewable tobacco products for at least 6 hours before surgery.  No nicotine patches on the day of surgery.  Do not use any recreational drugs for at least a week (preferably 2 weeks) before your surgery.  Please be advised that the combination of cocaine and anesthesia may have negative outcomes, up to and including death. If you test positive for  cocaine, your surgery will be cancelled.  On the morning of surgery brush your teeth with toothpaste and water, you may rinse your mouth with mouthwash if you wish. Do not swallow any toothpaste or mouthwash.  Use CHG Soap or wipes as directed on instruction sheet.  Do not wear jewelry, make-up, hairpins, clips or nail polish.  For welded (permanent) jewelry: bracelets, anklets, waist bands, etc.  Please have this removed prior to surgery.  If it is not removed, there is a chance that hospital personnel will need to cut it off on the day of surgery.  Do not wear lotions, powders, or perfumes.   Do not shave body hair from the neck down 48 hours before surgery.  Contact lenses, hearing aids and dentures may not be worn into surgery.  Do not bring valuables to the hospital. [facility] is not responsible for any missing/lost belongings or valuables.   Notify your doctor if there is any change in your medical condition (cold, fever, infection).  Wear comfortable clothing (specific to your surgery type) to the hospital.  After surgery, you can help prevent lung complications by doing breathing exercises.  Take deep breaths and cough every 1-2 hours. Your doctor may order a device called an Incentive Spirometer to help you take deep breaths  When coughing or sneezing, hold a pillow firmly against your incision with both hands. This is called splinting. Doing this helps protect your incision. It also decreases belly discomfort.  If  you are being admitted to the hospital overnight, leave your suitcase in the car. After surgery it may be brought to your room.  In case of increased patient census, it may be necessary for you, the patient, to continue your postoperative care in the Same Day Surgery department.  If you are being discharged the day of surgery, you will not be allowed to drive home. You will need a responsible individual to drive you home and stay with you for 24 hours after  surgery.   If you are taking public transportation, you will need to have a responsible individual with you.  Please call the Pre-admissions Testing Dept. at [phone] if you have any questions about these instructions.  Surgery Visitation Policy:  Patients having surgery or a procedure may have two visitors.  Children under the age of 74 must have an adult with them who is not the patient.  Inpatient Visitation:    Visiting hours are 7 a.m. to 8 p.m. Up to four visitors are allowed at one time in a patient room. The visitors may rotate out with other people during the day.  One visitor age 52 or older may stay with the patient overnight and must be in the room by 8 p.m.   Merchandiser, Retail to address health-related social needs:  [email or web address]                                                                                                            Preparing for Surgery with CHLORHEXIDINE  GLUCONATE (CHG) Soap  Chlorhexidine  Gluconate (CHG) Soap  o An antiseptic cleaner that kills germs and bonds with the skin to continue killing germs even after washing  o Used for showering the night before surgery and morning of surgery  Before surgery, you can play an important role by reducing the number of germs on your skin.  CHG (Chlorhexidine  gluconate) soap is an antiseptic cleanser which kills germs and bonds with the skin to continue killing germs even after washing.  Please do not use if you have an allergy to CHG or antibacterial soaps. If your skin becomes reddened/irritated stop using the CHG.  1. Shower the NIGHT BEFORE SURGERY with CHG soap.  2. If you choose to wash your hair, wash your hair first as usual with your normal shampoo.  3. After shampooing, rinse your hair and body thoroughly to remove the shampoo.  4. Use CHG as you would any other liquid soap. You can apply CHG directly to the skin and wash gently with a clean  washcloth.  5. Apply the CHG soap to your body only from the neck down. Do not use on open wounds or open sores. Avoid contact with your eyes, ears, mouth, and genitals (private parts). Wash face and genitals (private parts) with your normal soap.  6. Wash thoroughly, paying special attention to the area where your surgery will be performed.  7. Thoroughly rinse your body with warm water.  8. Do not shower/wash with your normal soap after using and rinsing off  the CHG soap.  9. Do not use lotions, oils, etc., after showering with CHG.  10. Pat yourself dry with a clean towel.  11. Wear clean pajamas to bed the night before surgery.  12. Place clean sheets on your bed the night of your shower and do not sleep with pets.  13. Do not apply any deodorants/lotions/powders.  14. Please wear clean clothes to the hospital.  15. Remember to brush your teeth with your regular toothpaste.

## 2024-09-25 ENCOUNTER — Encounter: Payer: Self-pay | Admitting: Surgery

## 2024-09-25 ENCOUNTER — Ambulatory Visit
Admission: RE | Admit: 2024-09-25 | Discharge: 2024-09-25 | Disposition: A | Discharge status: Home / Self Care | Attending: Surgery | Admitting: Surgery

## 2024-09-25 ENCOUNTER — Ambulatory Visit

## 2024-09-25 ENCOUNTER — Encounter: Admission: RE | Disposition: A | Payer: Self-pay | Source: Home / Self Care | Attending: Surgery

## 2024-09-25 ENCOUNTER — Other Ambulatory Visit: Payer: Self-pay

## 2024-09-25 DIAGNOSIS — K811 Chronic cholecystitis: Secondary | ICD-10-CM | POA: Diagnosis not present

## 2024-09-25 DIAGNOSIS — Z87891 Personal history of nicotine dependence: Secondary | ICD-10-CM | POA: Insufficient documentation

## 2024-09-25 DIAGNOSIS — K8050 Calculus of bile duct without cholangitis or cholecystitis without obstruction: Secondary | ICD-10-CM

## 2024-09-25 DIAGNOSIS — Z01812 Encounter for preprocedural laboratory examination: Secondary | ICD-10-CM

## 2024-09-25 DIAGNOSIS — R1013 Epigastric pain: Secondary | ICD-10-CM | POA: Diagnosis present

## 2024-09-25 MED ORDER — ACETAMINOPHEN 10 MG/ML IV SOLN
INTRAVENOUS | Status: AC
  Filled 2024-09-25: qty 100

## 2024-09-25 MED ORDER — ACETAMINOPHEN 10 MG/ML IV SOLN
INTRAVENOUS | Status: DC | PRN
  Administered 2024-09-25: 1000 mg via INTRAVENOUS

## 2024-09-25 MED ORDER — INDOCYANINE GREEN 25 MG IJ SOLR
INTRAMUSCULAR | Status: AC
  Filled 2024-09-25: qty 10

## 2024-09-25 MED ORDER — DEXAMETHASONE SOD PHOSPHATE PF 10 MG/ML IJ SOLN
INTRAMUSCULAR | Status: AC
  Filled 2024-09-25: qty 1

## 2024-09-25 MED ORDER — HYDROMORPHONE HCL 1 MG/ML IJ SOLN
INTRAMUSCULAR | Status: AC
  Filled 2024-09-25: qty 1

## 2024-09-25 MED ORDER — PROPOFOL 1000 MG/100ML IV EMUL
INTRAVENOUS | Status: AC
  Filled 2024-09-25: qty 100

## 2024-09-25 MED ORDER — KETOROLAC TROMETHAMINE 30 MG/ML IJ SOLN
INTRAMUSCULAR | Status: AC
  Filled 2024-09-25: qty 1

## 2024-09-25 MED ORDER — CHLORHEXIDINE GLUCONATE 0.12 % MT SOLN
OROMUCOSAL | Status: AC
  Filled 2024-09-25: qty 15

## 2024-09-25 MED ORDER — MIDAZOLAM HCL (PF) 2 MG/2ML IJ SOLN
INTRAMUSCULAR | Status: DC | PRN
  Administered 2024-09-25: 2 mg via INTRAVENOUS

## 2024-09-25 MED ORDER — ROCURONIUM BROMIDE 100 MG/10ML IV SOLN
INTRAVENOUS | Status: DC | PRN
  Administered 2024-09-25: 20 mg via INTRAVENOUS
  Administered 2024-09-25: 50 mg via INTRAVENOUS

## 2024-09-25 MED ORDER — MIDAZOLAM HCL 2 MG/2ML IJ SOLN
INTRAMUSCULAR | Status: AC
  Filled 2024-09-25: qty 2

## 2024-09-25 MED ORDER — DROPERIDOL 2.5 MG/ML IJ SOLN
INTRAMUSCULAR | Status: AC
  Filled 2024-09-25: qty 2

## 2024-09-25 MED ORDER — ROCURONIUM BROMIDE 10 MG/ML (PF) SYRINGE
PREFILLED_SYRINGE | INTRAVENOUS | Status: AC
  Filled 2024-09-25: qty 10

## 2024-09-25 MED ORDER — CEFAZOLIN SODIUM-DEXTROSE 2-4 GM/100ML-% IV SOLN
INTRAVENOUS | Status: AC
  Filled 2024-09-25: qty 100

## 2024-09-25 MED ORDER — OXYCODONE HCL 5 MG PO TABS
ORAL_TABLET | ORAL | Status: AC
  Filled 2024-09-25: qty 1

## 2024-09-25 MED ORDER — BUPIVACAINE-EPINEPHRINE (PF) 0.5% -1:200000 IJ SOLN
INTRAMUSCULAR | Status: DC | PRN
  Administered 2024-09-25: 30 mL

## 2024-09-25 MED ORDER — OXYCODONE HCL 5 MG PO TABS
5.0000 mg | ORAL_TABLET | Freq: Once | ORAL | Status: AC | PRN
  Administered 2024-09-25: 5 mg via ORAL

## 2024-09-25 MED ORDER — OXYCODONE HCL 5 MG/5ML PO SOLN: 5.0000 mg | Freq: Once | ORAL | Status: AC | PRN

## 2024-09-25 MED ORDER — FENTANYL CITRATE (PF) 100 MCG/2ML IJ SOLN
INTRAMUSCULAR | Status: DC | PRN
  Administered 2024-09-25 (×2): 50 ug via INTRAVENOUS

## 2024-09-25 MED ORDER — CEFAZOLIN SODIUM-DEXTROSE 2-4 GM/100ML-% IV SOLN
2.0000 g | INTRAVENOUS | Status: AC
  Administered 2024-09-25: 2 g via INTRAVENOUS

## 2024-09-25 MED ORDER — DEXAMETHASONE SOD PHOSPHATE PF 10 MG/ML IJ SOLN
INTRAMUSCULAR | Status: DC | PRN
  Administered 2024-09-25: 10 mg via INTRAVENOUS

## 2024-09-25 MED ORDER — BUPIVACAINE-EPINEPHRINE (PF) 0.5% -1:200000 IJ SOLN
INTRAMUSCULAR | Status: AC
  Filled 2024-09-25: qty 30

## 2024-09-25 MED ORDER — ACETAMINOPHEN 325 MG PO TABS
650.0000 mg | ORAL_TABLET | Freq: Three times a day (TID) | ORAL | 0 refills | Status: AC | PRN
  Filled 2024-09-25: qty 40, 7d supply, fill #0

## 2024-09-25 MED ORDER — ORAL CARE MOUTH RINSE: 15.0000 mL | Freq: Once | OROMUCOSAL | Status: AC

## 2024-09-25 MED ORDER — CHLORHEXIDINE GLUCONATE 0.12 % MT SOLN
15.0000 mL | Freq: Once | OROMUCOSAL | Status: AC
  Administered 2024-09-25: 15 mL via OROMUCOSAL

## 2024-09-25 MED ORDER — KETOROLAC TROMETHAMINE 30 MG/ML IJ SOLN
INTRAMUSCULAR | Status: DC | PRN
  Administered 2024-09-25: 30 mg via INTRAVENOUS

## 2024-09-25 MED ORDER — SUGAMMADEX SODIUM 200 MG/2ML IV SOLN
INTRAVENOUS | Status: DC | PRN
  Administered 2024-09-25: 200 mg via INTRAVENOUS
  Administered 2024-09-25: 100 mg via INTRAVENOUS

## 2024-09-25 MED ORDER — ONDANSETRON HCL 4 MG/2ML IJ SOLN
INTRAMUSCULAR | Status: AC
  Filled 2024-09-25: qty 2

## 2024-09-25 MED ORDER — OXYCODONE-ACETAMINOPHEN 5-325 MG PO TABS
1.0000 | ORAL_TABLET | Freq: Three times a day (TID) | ORAL | 0 refills | Status: AC | PRN
  Filled 2024-09-25: qty 6, 2d supply, fill #0

## 2024-09-25 MED ORDER — DOCUSATE SODIUM 100 MG PO CAPS
100.0000 mg | ORAL_CAPSULE | Freq: Two times a day (BID) | ORAL | 0 refills | Status: AC | PRN
  Filled 2024-09-25: qty 20, 10d supply, fill #0

## 2024-09-25 MED ORDER — DROPERIDOL 2.5 MG/ML IJ SOLN
0.6250 mg | Freq: Once | INTRAMUSCULAR | Status: AC
  Administered 2024-09-25: 0.625 mg via INTRAVENOUS

## 2024-09-25 MED ORDER — CHLORHEXIDINE GLUCONATE CLOTH 2 % EX PADS
6.0000 | MEDICATED_PAD | Freq: Once | CUTANEOUS | Status: AC
  Administered 2024-09-25: 6 via TOPICAL

## 2024-09-25 MED ORDER — LIDOCAINE HCL (PF) 2 % IJ SOLN
INTRAMUSCULAR | Status: AC
  Filled 2024-09-25: qty 5

## 2024-09-25 MED ORDER — ONDANSETRON HCL 4 MG/2ML IJ SOLN
INTRAMUSCULAR | Status: DC | PRN
  Administered 2024-09-25: 4 mg via INTRAVENOUS

## 2024-09-25 MED ORDER — INDOCYANINE GREEN 25 MG IJ SOLR
1.2500 mg | Freq: Once | INTRAMUSCULAR | Status: AC
  Administered 2024-09-25: 1.25 mg via INTRAVENOUS

## 2024-09-25 MED ORDER — LACTATED RINGERS IV SOLN: INTRAVENOUS | Status: DC

## 2024-09-25 MED ORDER — PROPOFOL 10 MG/ML IV BOLUS
INTRAVENOUS | Status: DC | PRN
  Administered 2024-09-25: 150 mg via INTRAVENOUS
  Administered 2024-09-25: 125 ug/kg/min via INTRAVENOUS

## 2024-09-25 MED ORDER — FENTANYL CITRATE (PF) 100 MCG/2ML IJ SOLN
INTRAMUSCULAR | Status: AC
  Filled 2024-09-25: qty 2

## 2024-09-25 MED ORDER — IBUPROFEN 400 MG PO TABS
400.0000 mg | ORAL_TABLET | Freq: Three times a day (TID) | ORAL | 0 refills | Status: AC | PRN
  Filled 2024-09-25: qty 30, 10d supply, fill #0

## 2024-09-25 MED ORDER — LIDOCAINE HCL (CARDIAC) PF 100 MG/5ML IV SOSY
PREFILLED_SYRINGE | INTRAVENOUS | Status: DC | PRN
  Administered 2024-09-25: 100 mg via INTRAVENOUS

## 2024-09-25 MED ORDER — HYDROMORPHONE HCL 1 MG/ML IJ SOLN
0.2500 mg | INTRAMUSCULAR | Status: DC | PRN
  Administered 2024-09-25: 0.5 mg via INTRAVENOUS
  Administered 2024-09-25: 0.25 mg via INTRAVENOUS

## 2024-09-25 NOTE — Discharge Instructions (Signed)
 Laparoscopic Cholecystectomy, Care After This sheet gives you information about how to care for yourself after your procedure. Your doctor may also give you more specific instructions. If you have problems or questions, contact your doctor. Follow these instructions at home: Care for cuts from surgery (incisions)  Follow instructions from your doctor about how to take care of your cuts from surgery. Make sure you: Wash your hands with soap and water before you change your bandage (dressing). If you cannot use soap and water, use hand sanitizer. Change your bandage as told by your doctor. Leave stitches (sutures), skin glue, or skin tape (adhesive) strips in place. They may need to stay in place for 2 weeks or longer. If tape strips get loose and curl up, you may trim the loose edges. Do not remove tape strips completely unless your doctor says it is okay. Do not take baths, swim, or use a hot tub until your doctor says it is okay. OK TO SHOWER 24HRS AFTER YOUR SURGERY.  Check your surgical cut area every day for signs of infection. Check for: More redness, swelling, or pain. More fluid or blood. Warmth. Pus or a bad smell. Activity Do not drive or use heavy machinery while taking prescription pain medicine. Do not play contact sports until your doctor says it is okay. Do not drive for 24 hours if you were given a medicine to help you relax (sedative). Rest as needed. Do not return to work or school until your doctor says it is okay. General instructions  tylenol and advil as needed for discomfort.  Please alternate between the two every four hours as needed for pain.    Use narcotics, if prescribed, only when tylenol and motrin is not enough to control pain.  325-650mg  every 8hrs to max of 3000mg /24hrs (including the 325mg  in every norco dose) for the tylenol.    Advil up to 800mg  per dose every 8hrs as needed for pain.   To prevent or treat constipation while you are taking prescription  pain medicine, your doctor may recommend that you: Drink enough fluid to keep your pee (urine) clear or pale yellow. Take over-the-counter or prescription medicines. Eat foods that are high in fiber, such as fresh fruits and vegetables, whole grains, and beans. Limit foods that are high in fat and processed sugars, such as fried and sweet foods. Contact a doctor if: You develop a rash. You have more redness, swelling, or pain around your surgical cuts. You have more fluid or blood coming from your surgical cuts. Your surgical cuts feel warm to the touch. You have pus or a bad smell coming from your surgical cuts. You have a fever. One or more of your surgical cuts breaks open. You have trouble breathing. You have chest pain. You have pain that is getting worse in your shoulders. You faint or feel dizzy when you stand. You have very bad pain in your belly (abdomen). You are sick to your stomach (nauseous) for more than one day. You have throwing up (vomiting) that lasts for more than one day. You have leg pain. This information is not intended to replace advice given to you by your health care provider. Make sure you discuss any questions you have with your health care provider. Document Released: 06/05/2008 Document Revised: 03/17/2016 Document Reviewed: 02/13/2016 Elsevier Interactive Patient Education  2019 [employer].

## 2024-09-25 NOTE — Transfer of Care (Signed)
 Immediate Anesthesia Transfer of Care Note  Patient: Sharon Weaver  Procedure(s) Performed: CHOLECYSTECTOMY, ROBOT-ASSISTED, LAPAROSCOPIC (Abdomen)  Patient Location: PACU  Anesthesia Type:General  Level of Consciousness: drowsy  Airway & Oxygen Therapy: Patient Spontanous Breathing and Patient connected to nasal cannula oxygen  Post-op Assessment: Report given to RN and Post -op Vital signs reviewed and stable  Post vital signs: Reviewed and stable  Last Vitals:  Vitals Value Taken Time  BP 124/74 09/25/24 08:39  Temp 36.6 C 09/25/24 08:39  Pulse 67 09/25/24 08:43  Resp 17 09/25/24 08:43  SpO2 99 % 09/25/24 08:43  Vitals shown include unfiled device data.  Last Pain:  Vitals:   09/25/24 0839  TempSrc:   PainSc: Asleep         Complications: There were no known notable events for this encounter.

## 2024-09-25 NOTE — Op Note (Signed)
 Preoperative diagnosis:  biliary colic  Postoperative diagnosis: same as above  Procedure: Robotic assisted Laparoscopic Cholecystectomy.   Anesthesia: GETA   Surgeon: Conrado Delay  Specimen: Gallbladder  Complications: None  EBL: 15mL  Wound Classification: Clean Contaminated  Indications: see HPI  Findings: Critical view of safety noted Cystic duct and artery identified, ligated and divided, clips remained intact at end of procedure Adequate hemostasis  Description of procedure:  The patient was placed on the operating table in the supine position. SCDs placed, pre-op abx administered.  General anesthesia was induced and OG tube placed by anesthesia. A time-out was completed verifying correct patient, procedure, site, positioning, and implant(s) and/or special equipment prior to beginning this procedure. The abdomen was prepped and draped in the usual sterile fashion.    Veress needle was placed at the Palmer's point and insufflation was started after confirming a positive saline drop test and no immediate increase in abdominal pressure.  After reaching 15 mm, the Veress needle was removed and a 8 mm port was placed via optiview technique under umbilicus measured 20mm from gallbladder.  The abdomen was inspected and no abnormalities or injuries were found.  Under direct vision, ports were placed in the following locations: One 12 mm patient left of the umbilicus, 8cm from the periumbilical port, one 8 mm port placed to the patient right of the umbilical port 8 cm apart.  1 additional 8 mm port placed lateral to the 12mm port.  Once ports were placed, The table was placed in the reverse Trendelenburg position with the right side up. The Xi platform was brought into the operative field and docked to the ports successfully.  An endoscope was placed through the umbilical port, fenestrated grasper through the adjacent patient right port, prograsp to the far patient left port, and then a hook  cautery in the left port.  The dome of the gallbladder was grasped with prograsp, passed and retracted over the dome of the liver. Adhesions between the gallbladder and omentum, duodenum and transverse colon were lysed via hook cautery. The infundibulum was grasped with the fenestrated grasper and retracted toward the right lower quadrant. This maneuver exposed Calot's triangle. The peritoneum overlying the gallbladder infundibulum was then dissected  and the cystic duct and cystic artery identified.  Critical view of safety with the liver bed clearly visible behind the duct and artery with no additional structures noted.  The cystic duct and cystic artery clipped and divided close to the gallbladder.     The gallbladder was then dissected from its peritoneal and liver bed attachments by electrocautery. Hemostasis was checked prior to removing the hook cautery and the Endo Catch bag was then placed through the 12 mm port and the gallbladder was removed.  The gallbladder was passed off the table as a specimen. There was no evidence of bleeding from the gallbladder fossa or cystic artery or leakage of the bile from the cystic duct stump. The 12 mm port site closed with PMI using 0 vicryl under direct vision.  Abdomen desufflated and secondary trocars were removed under direct vision. No bleeding was noted. All skin incisions then closed with subcuticular sutures of 4-0 monocryl and dressed with topical skin adhesive. The orogastric tube was removed and patient extubated.  The patient tolerated the procedure well and was taken to the postanesthesia care unit in stable condition.  All sponge and instrument count correct at end of procedure.

## 2024-09-25 NOTE — Interval H&P Note (Signed)
 No change. OK to proceed.

## 2024-09-25 NOTE — Anesthesia Preprocedure Evaluation (Signed)
"                                    Anesthesia Evaluation  Patient identified by MRN, date of birth, ID band Patient awake    Reviewed: Allergy & Precautions, NPO status , Patient's Chart, lab work & pertinent test results  History of Anesthesia Complications (+) PONV and history of anesthetic complications  Airway Mallampati: I  TM Distance: >3 FB Neck ROM: full    Dental no notable dental hx.    Pulmonary neg pulmonary ROS, former smoker   Pulmonary exam normal        Cardiovascular negative cardio ROS Normal cardiovascular exam     Neuro/Psych  Headaches  negative psych ROS   GI/Hepatic negative GI ROS, Neg liver ROS,,,  Endo/Other  negative endocrine ROS    Renal/GU negative Renal ROS     Musculoskeletal   Abdominal   Peds  Hematology negative hematology ROS (+)   Anesthesia Other Findings Past Medical History: No date: Dysfunctional uterine bleeding No date: Epigastric pain No date: Headache No date: PONV (postoperative nausea and vomiting)  Past Surgical History: 09/11/10, 03/10/2013: CESAREAN SECTION 03/28/2017: CESAREAN SECTION WITH BILATERAL TUBAL LIGATION; Bilateral     Comment:  Procedure: CESAREAN SECTION WITH BILATERAL TUBAL               LIGATION;  Surgeon: Arloa Lamar SQUIBB, MD;  Location: [facility]              ORS;  Service: Obstetrics;  Laterality: Bilateral; 01/09/2016: URETHRAL DIVERTICULUM REPAIR No date: WISDOM TOOTH EXTRACTION  BMI    Body Mass Index: 23.86 kg/m      Reproductive/Obstetrics negative OB ROS                              Anesthesia Physical Anesthesia Plan  ASA: 2  Anesthesia Plan: General ETT   Post-op Pain Management: Toradol  IV (intra-op)*, Ofirmev  IV (intra-op)*, Dilaudid  IV and Ketamine IV*   Induction: Intravenous  PONV Risk Score and Plan: 4 or greater and Ondansetron , Dexamethasone , Midazolam , Treatment may vary due to age or medical condition, Propofol  infusion  and TIVA  Airway Management Planned: Oral ETT  Additional Equipment:   Intra-op Plan:   Post-operative Plan: Extubation in OR  Informed Consent: I have reviewed the patients History and Physical, chart, labs and discussed the procedure including the risks, benefits and alternatives for the proposed anesthesia with the patient or authorized representative who has indicated his/her understanding and acceptance.     Dental Advisory Given  Plan Discussed with: Anesthesiologist, CRNA and Surgeon  Anesthesia Plan Comments: (Patient consented for risks of anesthesia including but not limited to:  - adverse reactions to medications - damage to eyes, teeth, lips or other oral mucosa - nerve damage due to positioning  - sore throat or hoarseness - Damage to heart, brain, nerves, lungs, other parts of body or loss of life  Patient voiced understanding and assent.)         Anesthesia Quick Evaluation  "

## 2024-09-25 NOTE — Anesthesia Procedure Notes (Signed)
 Procedure Name: Intubation Date/Time: 09/25/2024 7:38 AM  Performed by: Bonnetta Jimmey SAUNDERS, CRNAPre-anesthesia Checklist: Patient identified, Emergency Drugs available, Suction available and Patient being monitored Patient Re-evaluated:Patient Re-evaluated prior to induction Oxygen Delivery Method: Circle system utilized Preoxygenation: Pre-oxygenation with 100% oxygen Induction Type: IV induction Ventilation: Mask ventilation without difficulty Laryngoscope Size: McGrath and 3 Grade View: Grade I Tube type: Oral Tube size: 6.5 mm Number of attempts: 1 Airway Equipment and Method: Stylet and Oral airway Placement Confirmation: ETT inserted through vocal cords under direct vision, positive ETCO2 and breath sounds checked- equal and bilateral Secured at: 20 cm Tube secured with: Tape Dental Injury: Teeth and Oropharynx as per pre-operative assessment

## 2024-09-27 NOTE — Anesthesia Postprocedure Evaluation (Signed)
"   Anesthesia Post Note  Patient: Sharon Weaver  Procedure(s) Performed: CHOLECYSTECTOMY, ROBOT-ASSISTED, LAPAROSCOPIC (Abdomen)  Patient location during evaluation: PACU Anesthesia Type: General Level of consciousness: awake and alert Pain management: pain level controlled Vital Signs Assessment: post-procedure vital signs reviewed and stable Respiratory status: spontaneous breathing, nonlabored ventilation, respiratory function stable and patient connected to nasal cannula oxygen Cardiovascular status: blood pressure returned to baseline and stable Postop Assessment: no apparent nausea or vomiting Anesthetic complications: no   There were no known notable events for this encounter.   Last Vitals:  Vitals:   09/25/24 0954 09/25/24 1041  BP: 128/82 122/75  Pulse: 67 60  Resp: 20   Temp: (!) 36.3 C   SpO2: 100%     Last Pain:  Vitals:   09/25/24 0954  TempSrc: Temporal  PainSc: 6                  Sharon Weaver      "

## 2024-09-28 LAB — SURGICAL PATHOLOGY
